# Patient Record
Sex: Female | Born: 1965 | Race: White | Hispanic: No | Marital: Married | Smoking: Current every day smoker
Health system: Southern US, Community
[De-identification: ages and names within clinical notes are randomized; demographics above are authoritative.]

## PROBLEM LIST (undated history)

## (undated) DIAGNOSIS — I10 Essential (primary) hypertension: Secondary | ICD-10-CM

## (undated) DIAGNOSIS — M4310 Spondylolisthesis, site unspecified: Secondary | ICD-10-CM

## (undated) DIAGNOSIS — K279 Peptic ulcer, site unspecified, unspecified as acute or chronic, without hemorrhage or perforation: Secondary | ICD-10-CM

## (undated) DIAGNOSIS — Z9889 Other specified postprocedural states: Secondary | ICD-10-CM

## (undated) DIAGNOSIS — G43909 Migraine, unspecified, not intractable, without status migrainosus: Secondary | ICD-10-CM

## (undated) DIAGNOSIS — M549 Dorsalgia, unspecified: Secondary | ICD-10-CM

## (undated) DIAGNOSIS — Z720 Tobacco use: Secondary | ICD-10-CM

## (undated) HISTORY — DX: Tobacco use: Z72.0

## (undated) HISTORY — PX: COLONOSCOPY: SHX174

## (undated) HISTORY — DX: Spondylolisthesis, site unspecified: M43.10

## (undated) HISTORY — DX: Dorsalgia, unspecified: M54.9

## (undated) HISTORY — DX: Migraine, unspecified, not intractable, without status migrainosus: G43.909

## (undated) HISTORY — DX: Peptic ulcer, site unspecified, unspecified as acute or chronic, without hemorrhage or perforation: K27.9

## (undated) HISTORY — PX: DILATION AND CURETTAGE OF UTERUS: SHX78

## (undated) HISTORY — PX: LAPAROSCOPY: SHX197

---
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
## 2004-12-03 ENCOUNTER — Ambulatory Visit: Payer: Self-pay | Admitting: Chiropractic Medicine

## 2005-11-07 HISTORY — PX: ABDOMINAL HYSTERECTOMY: SHX81

## 2005-11-14 ENCOUNTER — Ambulatory Visit: Payer: Self-pay | Admitting: Internal Medicine

## 2006-02-03 ENCOUNTER — Ambulatory Visit: Payer: Self-pay | Admitting: Internal Medicine

## 2006-02-11 ENCOUNTER — Emergency Department: Payer: Self-pay | Admitting: Emergency Medicine

## 2006-03-09 ENCOUNTER — Ambulatory Visit: Payer: Self-pay | Admitting: Unknown Physician Specialty

## 2006-04-18 ENCOUNTER — Inpatient Hospital Stay: Payer: Self-pay | Admitting: Unknown Physician Specialty

## 2006-11-07 HISTORY — PX: CERVICAL FUSION: SHX112

## 2007-05-24 ENCOUNTER — Ambulatory Visit: Payer: Self-pay | Admitting: Chiropractic Medicine

## 2007-06-26 ENCOUNTER — Ambulatory Visit (HOSPITAL_COMMUNITY): Admission: RE | Admit: 2007-06-26 | Discharge: 2007-06-26 | Payer: Self-pay | Admitting: Neurosurgery

## 2007-08-15 ENCOUNTER — Encounter: Admission: RE | Admit: 2007-08-15 | Discharge: 2007-08-15 | Payer: Self-pay | Admitting: Neurosurgery

## 2009-01-21 ENCOUNTER — Ambulatory Visit: Payer: Self-pay

## 2010-02-24 ENCOUNTER — Ambulatory Visit: Payer: Self-pay | Admitting: Internal Medicine

## 2010-06-08 ENCOUNTER — Encounter: Admission: RE | Admit: 2010-06-08 | Discharge: 2010-06-08 | Payer: Self-pay | Admitting: Neurosurgery

## 2010-06-14 ENCOUNTER — Encounter: Payer: Self-pay | Admitting: Neurosurgery

## 2010-07-08 ENCOUNTER — Encounter: Payer: Self-pay | Admitting: Neurosurgery

## 2010-08-07 ENCOUNTER — Encounter: Payer: Self-pay | Admitting: Neurosurgery

## 2010-10-12 ENCOUNTER — Ambulatory Visit: Payer: Self-pay | Admitting: Gynecologic Oncology

## 2010-10-15 ENCOUNTER — Ambulatory Visit: Payer: Self-pay | Admitting: Unknown Physician Specialty

## 2010-11-07 ENCOUNTER — Ambulatory Visit: Payer: Self-pay | Admitting: Gynecologic Oncology

## 2011-03-22 NOTE — Op Note (Signed)
Rebecca Barker, Rebecca Barker            ACCOUNT NO.:  [id number]   MEDICAL RECORD NO.:  [id number]          PATIENT TYPE:  OIB   LOCATION:  3172                         FACILITY:  [facility]   PHYSICIAN:  Kathaleen Maser. Pool, M.D.    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   POSTOPERATIVE DIAGNOSIS:  Left C5-6 spondylosis and left C6-7  spondylosis with herniated pulposus and radiculopathy.   PROCEDURE NOTE:  C5-6 and C6-7 anterior cervical diskectomy and fusion  with allograft and anterior plating.   SURGEON:  Kathaleen Maser. Pool, M.D.   ASSISTANT:  Reinaldo Meeker, M.D.   ANESTHESIA:  General endotracheal.   INDICATIONS:  Rebecca Barker is a 45 year old female with a history of neck  and left upper extremity pain, paresthesias and weakness mostly  consistent with a left-sided C7 radiculopathy, although some elements of  a left-sided C6 radiculopathy as well.  Workup demonstrates evidence of  moderately severe spondylosis, worse on the left side at C5-6 with  neural foraminal narrowing.  The patient has evidence of marked  spondylosis with associated disk herniation at C6-7 off to the left side  causing marked compression of the left-sided C7 nerve root.  The patient  has been counseled as to her options.  She has decided to proceed with a  C5-6 and C6-7 anterior cervical diskectomy and fusion with allograft and  anterior plating in hopes of improving symptoms.   OPERATIVE NOTE:  The patient taken to the operating room and placed on  the table in a supine position.  After an adequate level of anesthesia  was achieved, the patient was positioned supine with the neck slightly  extended and held in place with halter traction.  The patient's anterior  cervical region was prepped and draped sterilely.   A 10 blade was used  to make a linear skin incision overlying the C5-6 interspace.  This was  carried down sharply to the platysma.  The platysma was then divided  vertically and dissection proceeded along the medial border of the  sternomastoid muscle and carotid sheath.  Trachea and esophagus were  mobilized and retracted towards the left.  Prevertebral fascia was  stripped off the anterior spinal column.  The longus colli muscle was  then elevated bilaterally using electrocautery.  Deep self-retaining  retractor was placed.  Intraoperative fluoroscopy was used and the C5-6  and C6-7 levels were confirmed.  Disk spaces at both levels were then  incised with a 15 blade in rectangular fashion.  A wide disk space clean-  out was achieved using pituitary rongeurs, forward and backward-angled  Carlen curettes, Kerrison rongeurs, high-speed drill.  All elements of  the disk were removed down to the level of the posterior annulus.  The  microscope was then brought to the field and used throughout the  remainder of the diskectomy.  Remaining aspects of the annulus and  osteophytes were removed using a high-speed drill  down to the level of  the posterior longitudinal ligament.  The posterior longitudinal  ligament was then elevated and resected in a piecemeal fashion using the  Kerrison rongeurs, carried down.  The thecal sac was then identified.  A  wide central decompression was then performed undercutting the bodies of  C5 and C6.  Decompression then proceeded out each neural foramen.  Wide  anterior foraminotomies were then performed along the course of the  exiting C6 nerve root bilaterally.  At this point a very thorough  decompression had been achieved.  There was no injury to thecal sac or  nerve roots.  The procedure was then repeated at C6-7 again without  complication.  The wound was then irrigated with antibiotic solution.  Gelfoam was placed topically for hemostasis and found to be  good.  A 5-  mm LifeNet allograft wedge was then impacted into place at C5-6 and  recessed roughly 1 mm from the anterior cortical margin.  A 6-mm graft  was placed at C6-7.  A 42-mm Atlantis anterior cervical plate was then  placed over the C5, C6 and C7 levels.  This was then attached under  fluoroscopic guidance using 13-mm variable-angle screws, two each at all  levels.  All screws were given a final tightening and found to be  solidly within bone.  The locking screws were engaged at all three  levels.  Final images revealed good position of the bone grafts and  hardware, proper operative level, and normal alignment of the spine.  The wound was then irrigated one final time.  Hemostasis was ensured  with bipolar electrocautery.  The wound was then closed in layers with  sutures.  A sterile dressing was then applied.  There were no  complications.  The patient tolerated the procedure well and she returns  to the recovery room postoperatively.           ______________________________  Kathaleen Maser Pool, M.D.     HAP/MEDQ  D:  06/26/2007  T:  06/26/2007  Job:  [id number]

## 2011-06-26 ENCOUNTER — Emergency Department: Payer: Self-pay | Admitting: *Deleted

## 2011-08-01 ENCOUNTER — Ambulatory Visit: Payer: Self-pay | Admitting: Gastroenterology

## 2011-08-04 LAB — PATHOLOGY REPORT

## 2011-08-19 LAB — CBC
Hemoglobin: 16.1 — ABNORMAL HIGH
MCHC: 34.5
MCV: 88.2
Platelets: 479 — ABNORMAL HIGH
RBC: 5.31 — ABNORMAL HIGH

## 2011-08-19 LAB — COMPREHENSIVE METABOLIC PANEL
ALT: 37 — ABNORMAL HIGH
AST: 24
Albumin: 3.7
BUN: 13
CO2: 33 — ABNORMAL HIGH
GFR calc non Af Amer: >60
Glucose, Bld: 79
Sodium: 140
Total Protein: 6.6

## 2011-09-16 ENCOUNTER — Ambulatory Visit: Payer: Self-pay | Admitting: Internal Medicine

## 2011-11-07 ENCOUNTER — Ambulatory Visit: Payer: Self-pay | Admitting: Internal Medicine

## 2012-05-14 ENCOUNTER — Ambulatory Visit: Payer: Self-pay | Admitting: Oncology

## 2014-04-03 DIAGNOSIS — E8940 Asymptomatic postprocedural ovarian failure: Secondary | ICD-10-CM | POA: Insufficient documentation

## 2014-04-03 DIAGNOSIS — K279 Peptic ulcer, site unspecified, unspecified as acute or chronic, without hemorrhage or perforation: Secondary | ICD-10-CM | POA: Insufficient documentation

## 2014-04-03 DIAGNOSIS — G43909 Migraine, unspecified, not intractable, without status migrainosus: Secondary | ICD-10-CM | POA: Insufficient documentation

## 2014-04-04 DIAGNOSIS — M519 Unspecified thoracic, thoracolumbar and lumbosacral intervertebral disc disorder: Secondary | ICD-10-CM | POA: Insufficient documentation

## 2014-06-18 ENCOUNTER — Ambulatory Visit: Payer: Self-pay | Admitting: Internal Medicine

## 2017-05-11 ENCOUNTER — Other Ambulatory Visit: Payer: Self-pay | Admitting: Internal Medicine

## 2017-05-11 DIAGNOSIS — M5116 Intervertebral disc disorders with radiculopathy, lumbar region: Secondary | ICD-10-CM

## 2017-05-13 ENCOUNTER — Ambulatory Visit: Payer: BC Managed Care – PPO

## 2017-05-16 ENCOUNTER — Ambulatory Visit
Admission: RE | Admit: 2017-05-16 | Discharge: 2017-05-16 | Disposition: A | Discharge status: Home / Self Care | Payer: BC Managed Care – PPO | Source: Ambulatory Visit | Attending: Internal Medicine | Admitting: Internal Medicine

## 2017-05-16 DIAGNOSIS — M5136 Other intervertebral disc degeneration, lumbar region: Secondary | ICD-10-CM | POA: Diagnosis not present

## 2017-05-16 DIAGNOSIS — M5116 Intervertebral disc disorders with radiculopathy, lumbar region: Secondary | ICD-10-CM

## 2017-05-16 DIAGNOSIS — M5416 Radiculopathy, lumbar region: Secondary | ICD-10-CM | POA: Insufficient documentation

## 2017-05-16 DIAGNOSIS — M4306 Spondylolysis, lumbar region: Secondary | ICD-10-CM | POA: Insufficient documentation

## 2017-05-16 DIAGNOSIS — M48061 Spinal stenosis, lumbar region without neurogenic claudication: Secondary | ICD-10-CM | POA: Diagnosis not present

## 2017-05-16 DIAGNOSIS — M5126 Other intervertebral disc displacement, lumbar region: Secondary | ICD-10-CM | POA: Diagnosis not present

## 2017-06-07 ENCOUNTER — Ambulatory Visit (HOSPITAL_BASED_OUTPATIENT_CLINIC_OR_DEPARTMENT_OTHER): Payer: BC Managed Care – PPO | Admitting: Student in an Organized Health Care Education/Training Program

## 2017-06-07 ENCOUNTER — Encounter: Payer: Self-pay | Admitting: Student in an Organized Health Care Education/Training Program

## 2017-06-07 ENCOUNTER — Ambulatory Visit
Admission: RE | Admit: 2017-06-07 | Discharge: 2017-06-07 | Disposition: A | Discharge status: Home / Self Care | Payer: BC Managed Care – PPO | Source: Ambulatory Visit | Attending: Student in an Organized Health Care Education/Training Program | Admitting: Student in an Organized Health Care Education/Training Program

## 2017-06-07 VITALS — BP 129/92 | HR 69 | Temp 97.0°F | Resp 16 | Ht 68.0 in | Wt 206.0 lb

## 2017-06-07 DIAGNOSIS — M4317 Spondylolisthesis, lumbosacral region: Secondary | ICD-10-CM | POA: Diagnosis not present

## 2017-06-07 DIAGNOSIS — M48061 Spinal stenosis, lumbar region without neurogenic claudication: Secondary | ICD-10-CM | POA: Diagnosis not present

## 2017-06-07 DIAGNOSIS — Z79899 Other long term (current) drug therapy: Secondary | ICD-10-CM | POA: Insufficient documentation

## 2017-06-07 DIAGNOSIS — G43909 Migraine, unspecified, not intractable, without status migrainosus: Secondary | ICD-10-CM | POA: Insufficient documentation

## 2017-06-07 DIAGNOSIS — Z6831 Body mass index (BMI) 31.0-31.9, adult: Secondary | ICD-10-CM | POA: Insufficient documentation

## 2017-06-07 DIAGNOSIS — M79604 Pain in right leg: Secondary | ICD-10-CM | POA: Diagnosis not present

## 2017-06-07 DIAGNOSIS — M5136 Other intervertebral disc degeneration, lumbar region: Secondary | ICD-10-CM

## 2017-06-07 DIAGNOSIS — M5416 Radiculopathy, lumbar region: Secondary | ICD-10-CM | POA: Diagnosis present

## 2017-06-07 DIAGNOSIS — M4306 Spondylolysis, lumbar region: Secondary | ICD-10-CM | POA: Diagnosis not present

## 2017-06-07 DIAGNOSIS — E669 Obesity, unspecified: Secondary | ICD-10-CM | POA: Insufficient documentation

## 2017-06-07 DIAGNOSIS — G43809 Other migraine, not intractable, without status migrainosus: Secondary | ICD-10-CM

## 2017-06-07 DIAGNOSIS — M79605 Pain in left leg: Secondary | ICD-10-CM

## 2017-06-07 DIAGNOSIS — M5116 Intervertebral disc disorders with radiculopathy, lumbar region: Secondary | ICD-10-CM | POA: Insufficient documentation

## 2017-06-07 MED ORDER — IOPAMIDOL (ISOVUE-M 200) INJECTION 41%
2.0000 mL | Freq: Once | INTRAMUSCULAR | Status: AC
  Administered 2017-06-07: 10 mL via EPIDURAL

## 2017-06-07 MED ORDER — DEXAMETHASONE SODIUM PHOSPHATE 10 MG/ML IJ SOLN
INTRAMUSCULAR | Status: AC
  Filled 2017-06-07: qty 1

## 2017-06-07 MED ORDER — LIDOCAINE HCL (PF) 1 % IJ SOLN
INTRAMUSCULAR | Status: AC
  Filled 2017-06-07: qty 5

## 2017-06-07 MED ORDER — BUPIVACAINE HCL (PF) 0.25 % IJ SOLN
1.0000 mL | Freq: Once | INTRAMUSCULAR | Status: AC
  Administered 2017-06-07: 30 mL

## 2017-06-07 MED ORDER — BUPIVACAINE HCL (PF) 0.25 % IJ SOLN
INTRAMUSCULAR | Status: AC
  Filled 2017-06-07: qty 30

## 2017-06-07 MED ORDER — SODIUM CHLORIDE 0.9 % IJ SOLN
INTRAMUSCULAR | Status: AC
  Filled 2017-06-07: qty 10

## 2017-06-07 MED ORDER — IOPAMIDOL (ISOVUE-M 200) INJECTION 41%
INTRAMUSCULAR | Status: AC
  Filled 2017-06-07: qty 10

## 2017-06-07 MED ORDER — SODIUM CHLORIDE 0.9% FLUSH
6.0000 mL | Freq: Once | INTRAVENOUS | Status: AC
  Administered 2017-06-07: 10 mL

## 2017-06-07 MED ORDER — LIDOCAINE HCL (PF) 1 % IJ SOLN
2.0000 mL | Freq: Once | INTRAMUSCULAR | Status: AC
  Administered 2017-06-07: 5 mL via INTRADERMAL

## 2017-06-07 MED ORDER — DEXAMETHASONE SODIUM PHOSPHATE 10 MG/ML IJ SOLN
10.0000 mg | Freq: Once | INTRAMUSCULAR | Status: AC
  Administered 2017-06-07: 10 mg via INTRA_ARTICULAR

## 2017-06-07 NOTE — Progress Notes (Signed)
Safety precautions to be maintained throughout the outpatient stay will include: orient to surroundings, keep bed in low position, maintain call bell within reach at all times, provide assistance with transfer out of bed and ambulation.  

## 2017-06-07 NOTE — Progress Notes (Signed)
Patient's Name: Rebecca Barker  MRN: [id number]  Referring Provider: Meade Maw, MD  DOB: Mar 09, 1966  PCP: Rusty Aus, MD  DOS: 06/07/2017  Note by: Gillis Santa, MD  Service setting: Ambulatory outpatient  Specialty: Interventional Pain Management  Location: [facility] (AMB) Pain Management Facility    Patient type: New patient ("FAST-TRACK" Evaluation)   Warning: This referral option does not include the extensive pharmacological evaluation required for [place] to take over the patient's medication management. The "Fast-Track" system is designed to bypass the new patient referral waiting list, as well as the normal patient evaluation process, in order to provide a patient in distress with a timely pain management intervention. Because the system was not designed to unfairly get a patient into our pain practice ahead of those already waiting, certain restrictions apply. By requesting a "Fast-Track" consult, the referring physician has opted to continue managing the patient's medications in order to get interventional urgent care.  Primary Reason for Visit: Interventional Pain Management Treatment. CC: Back Pain (low) and Leg Pain (right posterior to knee)   Procedure: Lumbar ESI (L4/L5)  HPI  Ms. Rebecca Barker is a 51 y.o. year old, female patient, who comes today for a  "Fast-Track" new patient evaluation, as requested by Meade Maw, MD. The patient has been made aware that this type of referral option is reserved for the Interventional Pain Management portion of our practice and completely excludes the option of medication management. Her primarily concern today is the Back Pain (low) and Leg Pain (right posterior to knee)  Pain Assessment: Location: Lower Back Radiating: right posterior leg Onset: More than a month ago Duration: Chronic pain Quality: Sharp, Sore, Constant (deep) Severity: 6 /10 (self-reported pain score)  Note: Reported level is compatible with observation.                    Effect on ADL:   Timing: Constant Modifying factors: positioning, TENS, bending and stretching  Onset and Duration: Gradual and Present longer than 3 months Cause of pain: Unknown Severity: No change since onset, NAS-11 at its worse: 10/10, NAS-11 at its best: 3/10, NAS-11 now: 4/10 and NAS-11 on the average: 7/10 Timing: Morning, Night, During activity or exercise, After activity or exercise and After a period of immobility Aggravating Factors: Bending, Lifiting, Prolonged sitting and Prolonged standing Alleviating Factors: Stretching, Hot packs, Medications and TENS Associated Problems: Numbness, Tingling and Pain that wakes patient up Quality of Pain: Aching, Burning, Constant, Deep, Sharp, Stabbing and Tender Previous Examinations or Tests: MRI scan and Neurological evaluation Previous Treatments: Chiropractic manipulations, Narcotic medications, Physical Therapy, Stretching exercises, TENS and Trigger point injections  The patient comes into the clinics today, referred to [place] for a epidural steroid injection.  Meds   Current Meds  Medication Sig  . buPROPion (WELLBUTRIN XL) 150 MG 24 hr tablet Take 150 mg by mouth daily.  . cyclobenzaprine (FLEXERIL) 10 MG tablet Take 10 mg by mouth as needed for muscle spasms.  . hydrochlorothiazide (HYDRODIURIL) 25 MG tablet Take 25 mg by mouth daily.  Marland Kitchen HYDROcodone-acetaminophen (NORCO/VICODIN) 5-325 MG tablet Take 1 tablet by mouth as needed for moderate pain.  . meloxicam (MOBIC) 7.5 MG tablet Take 7.5 mg by mouth as needed for pain.  . pantoprazole (PROTONIX) 40 MG tablet Take 40 mg by mouth daily.    Imaging Review  Cervical Imaging: Cervical MR wo contrast:  Results for orders placed in visit on 05/24/07  [place] W/O Cm  Narrative  PRIOR REPORT IMPORTED FROM AN EXTERNAL SYSTEM   PRIOR REPORT IMPORTED FROM THE [facility] WORKFLOW SYSTEM   REASON FOR EXAM:    severe neck lt arm pain with tingeing and numbness  COMMENTS:    PROCEDURE:     MR  - MR CERVICAL SPINE WO CONT  - May 24 2007  2:42PM   RESULT:     Comparison: No available comparison exam.   Procedure: Multiplanar, multisequence MR examination of the cervical spine  was performed without gadolinium contrast.   Findings:   There is straightening of the cervical spine without malalignment. The  cervical cord and cervicomedullary junction are is unremarkable. Small  lesion with increased T1 signal involving the C3 and C6 vertebrae likely  represents small hemangiomas. No other significant bone marrow signal  abnormality is noted.   Multilevel disc desiccation is noted. There is disc space height loss  involving the lower cervical spine, most prominently at the C6-C7 level,  where it is mild to moderate in severity. Central disc osteophyte  complexes  are noted at the C5-C7 levels with flattening of the anterior thecal sac.  There is mild right and mild to moderate left C6-C7 neural foraminal  narrowing.   IMPRESSION:   1. Degenerative changes of the lower cervical spine are seen an described  above. There is mild right and mild to moderate left C6-C7 neural  foraminal  narrowing.   Thank you for this opportunity to contribute to the care of your patient.       Cervical DG 1 view:  Results for orders placed during the hospital encounter of 06/26/07  DG Cervical Spine 1 View   Narrative Clinical Data: ACDF, HNP. CERVICAL SPINE - 1 VIEW: Comparison: None. Findings: Anterior cervical discectomy fusion hardware is seen from C5 through probable T1 level (inferior cervical spine less well visualized due to shoulders).  Posterior vertebral alignment is normally maintained. IMPRESSION:  Anterior cervical discectomy fusion hardware from C5 through probable T1.  Provider: Burnett Sheng, Tyson Dense, John Strider   Cervical DG 2-3 views:  Results for orders placed during the hospital encounter of 06/08/10  DG Cervical Spine 2-3 Views    Narrative Clinical Data: Cervical spine surgery in 2008, some neck pain   CERVICAL SPINE - 2-3 VIEW   Comparison: Cervical spine films of 08/15/2007   Findings: The cervical vertebrae are straightened in alignment. There does appear to be solid bony fusion from C5-C7 with no change in position of the anterior metallic fixation plate.  Through flexion and extension there is limited range of motion with no malalignment noted.   IMPRESSION: Straightened alignment.  Solid fusion from C5-C7.  Limited range of motion.  Provider: Pearla Dubonnet    Lumbosacral Imaging: Lumbar MR wo contrast:  Results for orders placed during the hospital encounter of 05/16/17  MR LUMBAR SPINE WO CONTRAST   Narrative CLINICAL DATA:  Initial evaluation for right-sided low back pain radiating into right lower extremity for 3 weeks.  EXAM: MRI LUMBAR SPINE WITHOUT CONTRAST  TECHNIQUE: Multiplanar, multisequence MR imaging of the lumbar spine was performed. No intravenous contrast was administered.  COMPARISON:  Prior MRI from 11/07/2011.  FINDINGS: Segmentation: Normal segmentation. Lowest well-formed disc is labeled the L5-S1 level.  Alignment: Trace retrolisthesis of L3 on L4. Vertebral bodies otherwise normally aligned with preservation of the normal lumbar lordosis.  Vertebrae: Vertebral body heights are maintained. No evidence for acute or chronic fracture. Few small benign hemangiomas noted. Chronic reactive  endplate changes noted about the L5-S1 interspace. Signal intensity within the vertebral body bone marrow otherwise normal. No worrisome osseous lesions. No abnormal marrow edema.  Conus medullaris: Extends to the L1 level and appears normal.  Paraspinal and other soft tissues: Paraspinous soft tissues within normal limits. Visualized visceral structures are normal.  Disc levels:  L1-2: Mild circumferential disc bulge with disc desiccation. Small posterior annular fissure noted. No  canal or foraminal stenosis.  L2-3:  Unremarkable.  L3-4: Diffuse circumferential disc bulge with disc desiccation. Mild facet and ligamentum flavum hypertrophy. No significant canal stenosis. Mild bilateral L3 foraminal narrowing.  L4-5:  Normal interspace.  Mild facet hypertrophy.  No stenosis.  L5-S1: Chronic degenerative intervertebral disc space narrowing with reactive endplate changes and marginal endplate osteophytes. Resultant shallow posterior disc osteophyte complex mildly indents the ventral thecal sac without significant stenosis. No neural impingement. Mild bilateral L5 foraminal narrowing.  IMPRESSION: 1. 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 2. Mild degenerative disc bulging at L1-2 and L3-4 without stenosis or neural impingement.   Electronically Signed   By: Jeannine Boga M.D.   On: 05/17/2017 04:35    Lumbar MR wo contrast:  Results for orders placed in visit on 11/07/11  MR [facility] W/O Cm   Narrative PRIOR REPORT IMPORTED FROM AN EXTERNAL SYSTEM   PRIOR REPORT IMPORTED FROM THE [facility] [place] FOR EXAM:    lumbar  radiculopathy  COMMENTS:   PROCEDURE:     MMR - MMR LUMBAR SPINE WO CONTRAST  - Nov 07 2011  9:13AM   RESULT:   Technique: Multiplanar and multisequence imaging of the lumbar spine was  obtained without the administration of gadolinium.   Conus medullaris terminates at an L1 level. The cauda equina demonstrate  no  evidence of clumping or thickening.   At the T12-L1 level, there is no evidence of thecal sac stenosis or  neuroforaminal narrowing.   At the L1-L2 level, a broad-based disc bulge appreciated with a focal area  of central protrusion. This causes partial effacement of the anterior CSF  space and mild to moderate thecal sac stenosis. There is no evidence of  neuroforaminal narrowing.   At the L2-L3 level, there is no evidence of thecal sac  stenosis or  neuroforaminal narrowing.   At the L3-L4 level, a broad-based disc bulge is appreciated causing  partial  effacement of the anterior CSF space. There is mild thecal sac narrowing.  No  evidence of neuroforaminal narrowing.   At the L4-L5 level, there is no evidence of thecal sac stenosis or  neuroforaminal narrowing.   At the L5-S1 level, a central disc protrusion is appreciated with minimal  effacement of the anterior CSF space and minimal thecal sac narrowing.  There  is lateralization of disc bulge to the left as well as to the right. This  continues to bilateral mild neuroforaminal narrowing also secondary to  endplate hypertrophic spurring. There does not appear to be evidence of  exiting nerve root compression though mild compromise cannot be excluded.  There is no evidence of marrow edema or linear signal void reflecting  acute  fracture.   IMPRESSION:   Degenerative disc disease changes as described above. There is moderate  thecal sac stenosis at the L2-L3 level and mild at the L5-S1 levels. Mild  bilateral neuroforaminal narrowing is appreciated L5-S1 as described  above.   Thank you for the opportunity to contribute to the  care of your patient.        Complexity Note: Imaging results reviewed. Results discussed using Layman's terms.               ROS  Cardiovascular History: No reported cardiovascular signs or symptoms such as High blood pressure, coronary artery disease, abnormal heart rate or rhythm, heart attack, blood thinner therapy or heart weakness and/or failure Pulmonary or Respiratory History: Smoking and Snoring  Neurological History: No reported neurological signs or symptoms such as seizures, abnormal skin sensations, urinary and/or fecal incontinence, being born with an abnormal open spine and/or a tethered spinal cord Review of Past Neurological Studies:  Results for orders placed or performed in visit on 02/03/06  MR Brain W Wo  Contrast   Narrative    PRIOR REPORT IMPORTED FROM AN EXTERNAL SYSTEM *  PRIOR REPORT IMPORTED FROM THE [facility] WORKFLOW SYSTEM   REASON FOR EXAM:  Progressive headaches  COMMENTS:   PROCEDURE:     MR  - MR BRAIN WO/W CONTRAST  - Feb 03 2006  9:37AM   RESULT:     The patient is experiencing progressive headaches.   There are no prior studies available for comparison.   TECHNIQUE: Multiplanar/multisequence imaging of the brain is obtained.   FINDINGS: Diffusion weighted images reveal no evidence of acute ischemia.  No  mass lesions or enhancing lesions are noted. A single, deep white matter,  RIGHT, periventricular punctate signal abnormality is noted on FLAIR  images.  This could represent a primary white matter lesion plaque as might be  caused  by MS, a focal site of other white matter disease including vasculitis or  chronic ischemia, or sequelae of migraine headaches. This is a solitary  lesion. The lesion is millimeters in size and does not enhance. The  posterior fossa is unremarkable. The brainstem is unremarkable. Vascular  flow-voids are normal.   IMPRESSION:   1.     Single white matter lesion in the RIGHT corona radiata adjacent to  the RIGHT lateral ventricle with differential diagnosis as noted above.  2.     The exam is otherwise unremarkable.   Thank you for this opportunity to contribute to the care of your patient.       Psychological-Psychiatric History: No reported psychological or psychiatric signs or symptoms such as difficulty sleeping, anxiety, depression, delusions or hallucinations (schizophrenial), mood swings (bipolar disorders) or suicidal ideations or attempts Gastrointestinal History: Vomiting blood (Ulcers) Genitourinary History: No reported renal or genitourinary signs or symptoms such as difficulty voiding or producing urine, peeing blood, non-functioning kidney, kidney stones, difficulty emptying the bladder, difficulty controlling the flow of  urine, or chronic kidney disease Hematological History: No reported hematological signs or symptoms such as prolonged bleeding, low or poor functioning platelets, bruising or bleeding easily, hereditary bleeding problems, low energy levels due to low hemoglobin or being anemic Endocrine History: No reported endocrine signs or symptoms such as high or low blood sugar, rapid heart rate due to high thyroid levels, obesity or weight gain due to slow thyroid or thyroid disease Rheumatologic History: No reported rheumatological signs and symptoms such as fatigue, joint pain, tenderness, swelling, redness, heat, stiffness, decreased range of motion, with or without associated rash Musculoskeletal History: Negative for myasthenia gravis, muscular dystrophy, multiple sclerosis or malignant hyperthermia Work History: Working full time  Allergies  Ms. Sterba is allergic to betadine [povidone iodine].  Laboratory Chemistry  Inflammation Markers (CRP: Acute Phase) (ESR: Chronic Phase) No results found for: CRP,  ESRSEDRATE               Renal Function Markers Lab Results  Component Value Date   BUN 13 06/22/2007   CREATININE 0.82 06/22/2007   GFRAA  06/22/2007    >60        The eGFR has been calculated using the MDRD equation. This calculation has not been validated in all clinical   GFRNONAA >60 06/22/2007                 Hepatic Function Markers Lab Results  Component Value Date   AST 24 06/22/2007   ALT 37 (H) 06/22/2007   ALBUMIN 3.7 06/22/2007   ALKPHOS 86 06/22/2007                 Electrolytes Lab Results  Component Value Date   NA 140 06/22/2007   K 3.5 06/22/2007   CL 99 06/22/2007   CALCIUM 9.7 06/22/2007                 Neuropathy Markers No results found for: [id number]               Bone Pathology Markers Lab Results  Component Value Date   ALKPHOS 86 06/22/2007   CALCIUM 9.7 06/22/2007                 Coagulation Parameters Lab Results  Component Value Date    PLT 479 (H) 06/22/2007                 Cardiovascular Markers Lab Results  Component Value Date   HGB 16.1 (H) 06/22/2007   HCT 46.8 (H) 06/22/2007                 Note: Lab results reviewed.  [place]  Drug: Ms. Leason  has no drug history on file. Alcohol:  has no alcohol history on file. Tobacco:  reports that she has been smoking Cigarettes.  She started smoking about 15 years ago. She has a 7.50 pack-year smoking history. She has never used smokeless tobacco. Medical:  has a past medical history of Migraines and Peptic ulcer. Family: family history includes Alcohol abuse in her paternal grandfather; Arthritis in her maternal grandmother, mother, and paternal grandmother; Birth defects in her brother; COPD in her father; Cancer in her paternal grandmother; Diabetes in her maternal grandfather; Hypertension in her mother; Kidney disease in her maternal grandmother; Learning disabilities in her brother; Mental retardation in her brother; Miscarriages / Stillbirths in her mother; Stroke in her maternal grandfather and maternal grandmother; Varicose Veins in her mother.  Past Surgical History:  Procedure Laterality Date  . CERVICAL FUSION  2008  . [place] and 2001  . FOOT SURGERY  1980  . LAPAROSCOPY     Active Ambulatory Problems    Diagnosis Date Noted  . No Active Ambulatory Problems   Resolved Ambulatory Problems    Diagnosis Date Noted  . No Resolved Ambulatory Problems   Past Medical History:  Diagnosis Date  . Migraines   . Peptic ulcer    Constitutional Exam  General appearance: Well nourished, well developed, and well hydrated. In no apparent acute distress Vitals:   06/07/17 1205 06/07/17 1210 06/07/17 1215 06/07/17 1220  BP: 137/87 129/85 118/86 (!) 129/92  Pulse: 76 76  69  Resp: _0 Temp:  (!) 97 F (36.1 C) (!) 97 F (36.1 C)   TempSrc:  Tympanic    SpO2:  99% 99%  99%  Weight:      Height:       BMI Assessment: Estimated body  mass index is 31.32 kg/m as calculated from the following:   Height as of this encounter: _0  (1.727 m).   Weight as of this encounter: 206 lb (93.4 kg).  BMI interpretation table: BMI level Category Range association with higher incidence of chronic pain  <18 kg/m2 Underweight   18.5-24.9 kg/m2 Ideal body weight   25-29.9 kg/m2 Overweight Increased incidence by 20%  30-34.9 kg/m2 Obese (Class I) Increased incidence by 68%  35-39.9 kg/m2 Severe obesity (Class II) Increased incidence by 136%  >40 kg/m2 Extreme obesity (Class III) Increased incidence by 254%   BMI Readings from Last 4 Encounters:  06/07/17 31.32 kg/m   Wt Readings from Last 4 Encounters:  06/07/17 206 lb (93.4 kg)  Psych/Mental status: Alert, oriented x 3 (person, place, & time)       Eyes: PERLA Respiratory: No evidence of acute respiratory distress  Lumbar Spine Exam  Inspection: No masses, redness, or swelling Alignment: Symmetrical Functional ROM: Unrestricted ROM      Stability: No instability detected Muscle strength & Tone: Functionally intact Sensory: Unimpaired Palpation: No palpable anomalies       Provocative Tests: Lumbar Hyperextension and rotation test: Negative       Lumbar Lateral bending test: Negative       Patrick's Maneuver: Negative                    Gait & Posture Assessment  Ambulation: Unassisted Gait: Relatively normal for age and body habitus Posture: WNL   Lower Extremity Exam    Side: Right lower extremity  Side: Left lower extremity  Inspection: No masses, redness, swelling, or asymmetry. No contractures  Inspection: No masses, redness, swelling, or asymmetry. No contractures  Functional ROM: Unrestricted ROM          Functional ROM: Unrestricted ROM          Muscle strength & Tone: Functionally intact  Muscle strength & Tone: Functionally intact  Sensory: Unimpaired  Sensory: Unimpaired  Palpation: No palpable anomalies  Palpation: No palpable anomalies   1. Lumbar  radiculopathy   2. Lumbar spondylolysis   3. Pain in both lower extremities   4. Lumbar degenerative disc disease   5. Other migraine without status migrainosus, not intractable   6. Obesity, unspecified classification, unspecified obesity type, unspecified whether serious comorbidity present   7. Spinal stenosis of lumbar region without neurogenic claudication     Procedure- Lumbar Epidural Steroid Injection- see procedure note for procedural details  Patient is a 51 year old female with a past medical history of migraine headaches, lumbar degenerative disc disease, mild to moderate lumbar spinal canal stenosis, lumbar facet arthropathy, and subacute lumbar radiculopathy with low back pain and leg pain that is acutely worsened over the last month. Patient is being being referred to [place] from Dr. Cari Caraway.  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 Patient does endorse pain that radiates from her back into her legs in a dermatomal pattern.  Today we discussed the potential risks and benefits of a epidural steroid injection. Patient would like to proceed. She is continuing to do exercises at home.

## 2017-06-07 NOTE — Progress Notes (Signed)
Spoke with patient today had approximately 3:10 PM after her epidural steroid injection earlier this afternoon with the patient had a vasovagal reaction. She states that she is doing very well at home right now. She states that she had lunch and something to drink and that her mild headache has improved. In regards to her leg pain, she endorses known. She is able to ambulate around her house without as much discomfort and states that right now her pain score is 0. We will follow-up with her tomorrow to see how she is doing. If she has any questions or concerns, I have given her our clinic phone number to call.

## 2017-06-07 NOTE — Progress Notes (Signed)
Patient's Name: Rebecca Barker  MRN: [id number]  Referring Provider: Meade Maw, MD  DOB: [birth year]  PCP: Rusty Aus, MD  DOS: 06/07/2017  Note by: Gillis Santa, MD  Service setting: Ambulatory outpatient  Specialty: Interventional Pain Management  Patient type: Established  Location: [facility] (AMB) Pain Management Facility  Visit type: Interventional Procedure   Primary Reason for Visit: Interventional Pain Management Treatment. CC: Back Pain (low) and Leg Pain (right posterior to knee)  Procedure:  Anesthesia, Analgesia, Anxiolysis:  Type: Therapeutic Inter-Laminar Epidural Steroid Injection Region: Lumbar Level: L4-5 Level (attempted L5/S1 but encountered os and patient have discomfort even after additional local anesthetic through tuohy so moved to interspace above) Laterality: Midline         Type: Local Anesthesia Local Anesthetic: Lidocaine 1% Route: Infiltration ([place]/IM) IV Access: Secured Sedation: Meaningful verbal contact was maintained at all times during the procedure  Indication(s): Analgesia and Anxiety  Indications: 1. Lumbar radiculopathy   2. Lumbar spondylolysis   3. Pain in both lower extremities   4. Lumbar degenerative disc disease   5. Other migraine without status migrainosus, not intractable   6. Obesity, unspecified classification, unspecified obesity type, unspecified whether serious comorbidity present   7. Spinal stenosis of lumbar region without neurogenic claudication    Pain Score: Pre-procedure: 6 /10 Post-procedure: 0-No pain/10  Pre-op Assessment:  Previous date of service: 06/07/17 (new patient) Service provided:   Rebecca Barker is a 51 y.o. (year old), female patient, seen today for interventional treatment. She  has a past surgical history that includes Foot surgery (1980); Cesarean section (1999 and 2001); laparoscopy; and Cervical fusion (2008). Rebecca Barker has a current medication list which includes the following prescription(s):  bupropion, cyclobenzaprine, hydrochlorothiazide, hydrocodone-acetaminophen, meloxicam, and pantoprazole. Her primarily concern today is the Back Pain (low) and Leg Pain (right posterior to knee) (please see H&P note today since this patient was a fast-track patient)  Initial Vital Signs: Blood pressure (!) 135/96, pulse 77, temperature 98.2 F (36.8 C), temperature source Oral, resp. rate 18, height 5\' 8"  (1.727 m), weight 206 lb (93.4 kg), SpO2 98 %. BMI: Estimated body mass index is 31.32 kg/m as calculated from the following:   Height as of this encounter: 5\' 8"  (1.727 m).   Weight as of this encounter: 206 lb (93.4 kg).  Risk Assessment: Allergies: Reviewed. She is allergic to betadine [povidone iodine].  Allergy Precautions: None required Coagulopathies: Reviewed. None identified.  Blood-thinner therapy: None at this time Active Infection(s): Reviewed. None identified. Rebecca Barker is afebrile  Site Confirmation: Rebecca Barker was asked to confirm the procedure and laterality before marking the site Procedure checklist: Completed Consent: Before the procedure and under the influence of no sedative(s), amnesic(s), or anxiolytics, the patient was informed of the treatment options, risks and possible complications. To fulfill our ethical and legal obligations, as recommended by the American Medical Association's Code of Ethics, I have informed the patient of my clinical impression; the nature and purpose of the treatment or procedure; the risks, benefits, and possible complications of the intervention; the alternatives, including doing nothing; the risk(s) and benefit(s) of the alternative treatment(s) or procedure(s); and the risk(s) and benefit(s) of doing nothing. The patient was provided information about the general risks and possible complications associated with the procedure. These may include, but are not limited to: failure to achieve desired goals, infection, bleeding, organ or nerve  damage, allergic reactions, paralysis, and death. In addition, the patient was informed of those risks and  complications associated to Spine-related procedures, such as failure to decrease pain; infection (i.e.: Meningitis, epidural or intraspinal abscess); bleeding (i.e.: epidural hematoma, subarachnoid hemorrhage, or any other type of intraspinal or peri-dural bleeding); organ or nerve damage (i.e.: Any type of peripheral nerve, nerve root, or spinal cord injury) with subsequent damage to sensory, motor, and/or autonomic systems, resulting in permanent pain, numbness, and/or weakness of one or several areas of the body; allergic reactions; (i.e.: anaphylactic reaction); and/or death. Furthermore, the patient was informed of those risks and complications associated with the medications. These include, but are not limited to: allergic reactions (i.e.: anaphylactic or anaphylactoid reaction(s)); adrenal axis suppression; blood sugar elevation that in diabetics may result in ketoacidosis or comma; water retention that in patients with history of congestive heart failure may result in shortness of breath, pulmonary edema, and decompensation with resultant heart failure; weight gain; swelling or edema; medication-induced neural toxicity; particulate matter embolism and blood vessel occlusion with resultant organ, and/or nervous system infarction; and/or aseptic necrosis of one or more joints. Finally, the patient was informed that Medicine is not an exact science; therefore, there is also the possibility of unforeseen or unpredictable risks and/or possible complications that may result in a catastrophic outcome. The patient indicated having understood very clearly. We have given the patient no guarantees and we have made no promises. Enough time was given to the patient to ask questions, all of which were answered to the patient's satisfaction. Rebecca Barker has indicated that she wanted to continue with the  procedure. Attestation: I, the ordering provider, attest that I have discussed with the patient the benefits, risks, side-effects, alternatives, likelihood of achieving goals, and potential problems during recovery for the procedure that I have provided informed consent. Date: 06/07/2017; Time: 10:53 AM  Pre-Procedure Preparation:  Monitoring: As per clinic protocol. Respiration, ETCO2, SpO2, BP, heart rate and rhythm monitor placed and checked for adequate function Safety Precautions: Patient was assessed for positional comfort and pressure points before starting the procedure. Time-out: I initiated and conducted the "Time-out" before starting the procedure, as per protocol. The patient was asked to participate by confirming the accuracy of the "Time Out" information. Verification of the correct person, site, and procedure were performed and confirmed by me, the nursing staff, and the patient. "Time-out" conducted as per Joint Commission's Universal Protocol (UP.01.01.01). "Time-out" Date & Time: 06/07/2017; 1131 hrs.  Description of Procedure Process:   Position: Prone with head of the table was raised to facilitate breathing. Target Area: The interlaminar space, initially targeting the lower laminar border of the superior vertebral body. Approach: Paramedial approach. Area Prepped: Entire Posterior Lumbar Region Prepping solution: ChloraPrep (2% chlorhexidine gluconate and 70% isopropyl alcohol) Safety Precautions: Aspiration looking for blood return was conducted prior to all injections. At no point did we inject any substances, as a needle was being advanced. No attempts were made at seeking any paresthesias. Safe injection practices and needle disposal techniques used. Medications properly checked for expiration dates. SDV (single dose vial) medications used. Description of the Procedure: Protocol guidelines were followed. The procedure needle was introduced through the skin, ipsilateral to the  reported pain, and advanced to the target area. Bone was contacted and the needle walked caudad, until the lamina was cleared. The epidural space was identified using "loss-of-resistance technique" with 2-3 ml of PF-NaCl (0.9% NSS), in a 5cc LOR glass syringe. Vitals:   06/07/17 1205 06/07/17 1210 06/07/17 1215 06/07/17 1220  BP: 137/87 129/85 118/86 (!) 129/92  Pulse: 76  76  69  Resp: 16 16 16 16   Temp:  (!) 97 F (36.1 C) (!) 97 F (36.1 C)   TempSrc:  Tympanic    SpO2: 99% 99%  99%  Weight:      Height:        Start Time: 1137 hrs. End Time:   hrs. Materials:  Needle(s) Type: Epidural needle Gauge: 17G Length: 3.5-in Medication(s): We administered bupivacaine (PF), lidocaine (PF), dexamethasone, iopamidol, and sodium chloride flush. Please see chart orders for dosing details.  Imaging Guidance (Spinal):  Type of Imaging Technique: Fluoroscopy Guidance (Spinal) Indication(s): Assistance in needle guidance and placement for procedures requiring needle placement in or near specific anatomical locations not easily accessible without such assistance. Exposure Time: Please see nurses notes. Contrast: Before injecting any contrast, we confirmed that the patient did not have an allergy to iodine, shellfish, or radiological contrast. Once satisfactory needle placement was completed at the desired level, radiological contrast was injected. Contrast injected under live fluoroscopy. No contrast complications. See chart for type and volume of contrast used. Fluoroscopic Guidance: I was personally present during the use of fluoroscopy. "Tunnel Vision Technique" used to obtain the best possible view of the target area. Parallax error corrected before commencing the procedure. "Direction-depth-direction" technique used to introduce the needle under continuous pulsed fluoroscopy. Once target was reached, antero-posterior, oblique, and lateral fluoroscopic projection used confirm needle placement in all  planes. Images permanently stored in EMR. Interpretation: I personally interpreted the imaging intraoperatively. Adequate needle placement confirmed in multiple planes. Appropriate spread of contrast into desired area was observed. No evidence of afferent or efferent intravascular uptake. No intrathecal or subarachnoid spread observed. Permanent images saved into the patient's record.  Antibiotic Prophylaxis:  Indication(s): None identified Antibiotic given: None  Post-operative Assessment:  EBL: None Complications: No heme, CSF, or paresthesias. after the procedure was complete, the patient did have a vasovagal response where she had dizziness, diaphoresis, lower blood pressures and her baseline. Patient was transferred over to a stretcher. She was placed in Trendelenburg position so that her legs were in the air to facilitate preload back to the heart. At that time no shortness of breath or chest pain. 5 out of 5 strength in bilateral biceps, 5 out of 5 strength lower extremity to knee flexion, knee extension, ankle plantar flexion and dorsiflexion. Patient was taken to the recovery room where she was given crackers and something to drink and started feeling better. Patient did endorse a small left frontal headache upon leaving. I encouraged her to rest today and that we would call her tomorrow regarding her health. She is supposed to follow with me in 2 weeks. Note: The patient tolerated the entire procedure well. A repeat set of vitals were taken after the procedure and the patient was kept under observation following institutional policy, for this type of procedure. Post-procedural neurological assessment was performed, showing return to baseline, prior to discharge. The patient was provided with post-procedure discharge instructions, including a section on how to identify potential problems. Should any problems arise concerning this procedure, the patient was given instructions to immediately contact  us, at any time, without hesitation. In any case, we plan to contact the patient by telephone for a follow-up status report regarding this interventional procedure. Comments:  No additional relevant information.  Plan of Care  Disposition: Discharge home  Discharge Date & Time: 06/07/2017;   hrs.  Physician-requested Follow-up:  No Follow-up on file.  Future Appointments Date Time Provider [place]  06/20/2017 10:15 AM Gillis Santa, MD [facility] None   Medications ordered for procedure: Meds ordered this encounter  Medications  . bupivacaine (PF) (MARCAINE) 0.25 % injection 1 mL  . lidocaine (PF) (XYLOCAINE) 1 % injection 2 mL  . dexamethasone (DECADRON) injection 10 mg  . iopamidol (ISOVUE-M) 41 % intrathecal injection 2 mL  . sodium chloride flush (NS) 0.9 % injection 6 mL   Medications administered: We administered bupivacaine (PF), lidocaine (PF), dexamethasone, iopamidol, and sodium chloride flush.  See the medical record for exact dosing, route, and time of administration. Preservative-free normal saline: 6, Decadron 10 mg, bupivacaine 0.25% 1 cc, total 8 cc at L4-L5.  Lab-work, Procedure(s), & Referral(s) Ordered: Orders Placed This Encounter  Procedures  . Lumbar Epidural Injection  . DG C-Arm 1-60 Min-No Report  . Informed Consent Details: Transcribe to consent form and obtain patient signature  . Provider attestation of informed consent for procedure/surgical case  . Verify informed consent  . Discharge instructions  . Follow-up   Imaging Ordered: No results found for this or any previous visit. New Prescriptions   No medications on file   Primary Care Physician: Rusty Aus, MD Location: [facility] Note by: Gillis Santa, MD Date: 06/07/2017; Time: 12:44 PM  Disclaimer:  Medicine is not an exact science. The only guarantee in medicine is that nothing is guaranteed. It is important to note that the decision to proceed  with this intervention was based on the information collected from the patient. The Data and conclusions were drawn from the patient's questionnaire, the interview, and the physical examination. Because the information was provided in large part by the patient, it cannot be guaranteed that it has not been purposely or unconsciously manipulated. Every effort has been made to obtain as much relevant data as possible for this evaluation. It is important to note that the conclusions that lead to this procedure are derived in large part from the available data. Always take into account that the treatment will also be dependent on availability of resources and existing treatment guidelines, considered by other Pain Management Practitioners as being common knowledge and practice, at the time of the intervention. For Medico-Legal purposes, it is also important to point out that variation in procedural techniques and pharmacological choices are the acceptable norm. The indications, contraindications, technique, and results of the above procedure should only be interpreted and judged by a Board-Certified Interventional Pain Specialist with extensive familiarity and expertise in the same exact procedure and technique.  Instructions provided at this appointment: Patient Instructions  Epidural Steroid Injection An epidural steroid injection is a shot of steroid medicine and numbing medicine that is given into the space between the spinal cord and the bones in your back (epidural space). The shot helps relieve pain caused by an irritated or swollen nerve root. The amount of pain relief you get from the injection depends on what is causing the nerve to be swollen and irritated, and how long your pain lasts. You are more likely to benefit from this injection if your pain is strong and comes on suddenly rather than if you have had pain for a long time. Tell a health care provider about:  Any allergies you have.  All  medicines you are taking, including vitamins, herbs, eye drops, creams, and over-the-counter medicines.  Any problems you or family members have had with anesthetic medicines.  Any blood disorders you have.  Any surgeries you have had.  Any medical conditions you  have.  Whether you are pregnant or may be pregnant. What are the risks? Generally, this is a safe procedure. However, problems may occur, including:  Headache.  Bleeding.  Infection.  Allergic reaction to medicines.  Damage to your nerves.  What happens before the procedure? Staying hydrated Follow instructions from your health care provider about hydration, which may include:  Up to 2 hours before the procedure - you may continue to drink clear liquids, such as water, clear fruit juice, black coffee, and plain tea.  Eating and drinking restrictions Follow instructions from your health care provider about eating and drinking, which may include:  8 hours before the procedure - stop eating heavy meals or foods such as meat, fried foods, or fatty foods.  6 hours before the procedure - stop eating light meals or foods, such as toast or cereal.  6 hours before the procedure - stop drinking milk or drinks that contain milk.  2 hours before the procedure - stop drinking clear liquids.  Medicine  You may be given medicines to lower anxiety.  Ask your health care provider about: ? Changing or stopping your regular medicines. This is especially important if you are taking diabetes medicines or blood thinners. ? Taking medicines such as aspirin and ibuprofen. These medicines can thin your blood. Do not take these medicines before your procedure if your health care provider instructs you not to. General instructions  Plan to have someone take you home from the hospital or clinic. What happens during the procedure?  You may receive a medicine to help you relax (sedative).  You will be asked to lie on your  abdomen.  The injection site will be cleaned.  A numbing medicine (local anesthetic) will be used to numb the injection site.  A needle will be inserted through your skin into the epidural space. You may feel some discomfort when this happens. An X-ray machine will be used to make sure the needle is put as close as possible to the affected nerve.  A steroid medicine and a local anesthetic will be injected into the epidural space.  The needle will be removed.  A bandage (dressing) will be put over the injection site. What happens after the procedure?  Your blood pressure, heart rate, breathing rate, and blood oxygen level will be monitored until the medicines you were given have worn off.  Your arm or leg may feel weak or numb for a few hours.  The injection site may feel sore.  Do not drive for 24 hours if you received a sedative. This information is not intended to replace advice given to you by your health care provider. Make sure you discuss any questions you have with your health care provider. Document Released: 01/31/2008 Document Revised: 04/06/2016 Document Reviewed: 02/09/2016 Elsevier Interactive Patient Education  2017 [employer].

## 2017-06-07 NOTE — Patient Instructions (Signed)
Epidural Steroid Injection An epidural steroid injection is a shot of steroid medicine and numbing medicine that is given into the space between the spinal cord and the bones in your back (epidural space). The shot helps relieve pain caused by an irritated or swollen nerve root. The amount of pain relief you get from the injection depends on what is causing the nerve to be swollen and irritated, and how long your pain lasts. You are more likely to benefit from this injection if your pain is strong and comes on suddenly rather than if you have had pain for a long time. Tell a health care provider about:  Any allergies you have.  All medicines you are taking, including vitamins, herbs, eye drops, creams, and over-the-counter medicines.  Any problems you or family members have had with anesthetic medicines.  Any blood disorders you have.  Any surgeries you have had.  Any medical conditions you have.  Whether you are pregnant or may be pregnant. What are the risks? Generally, this is a safe procedure. However, problems may occur, including:  Headache.  Bleeding.  Infection.  Allergic reaction to medicines.  Damage to your nerves. What happens before the procedure? Staying hydrated  Follow instructions from your health care provider about hydration, which may include:  Up to 2 hours before the procedure - you may continue to drink clear liquids, such as water, clear fruit juice, black coffee, and plain tea. Eating and drinking restrictions  Follow instructions from your health care provider about eating and drinking, which may include:  8 hours before the procedure - stop eating heavy meals or foods such as meat, fried foods, or fatty foods.  6 hours before the procedure - stop eating light meals or foods, such as toast or cereal.  6 hours before the procedure - stop drinking milk or drinks that contain milk.  2 hours before the procedure - stop drinking clear  liquids. Medicine  You may be given medicines to lower anxiety.  Ask your health care provider about:  Changing or stopping your regular medicines. This is especially important if you are taking diabetes medicines or blood thinners.  Taking medicines such as aspirin and ibuprofen. These medicines can thin your blood. Do not take these medicines before your procedure if your health care provider instructs you not to. General instructions  Plan to have someone take you home from the hospital or clinic. What happens during the procedure?  You may receive a medicine to help you relax (sedative).  You will be asked to lie on your abdomen.  The injection site will be cleaned.  A numbing medicine (local anesthetic) will be used to numb the injection site.  A needle will be inserted through your skin into the epidural space. You may feel some discomfort when this happens. An X-ray machine will be used to make sure the needle is put as close as possible to the affected nerve.  A steroid medicine and a local anesthetic will be injected into the epidural space.  The needle will be removed.  A bandage (dressing) will be put over the injection site. What happens after the procedure?  Your blood pressure, heart rate, breathing rate, and blood oxygen level will be monitored until the medicines you were given have worn off.  Your arm or leg may feel weak or numb for a few hours.  The injection site may feel sore.  Do not drive for 24 hours if you received a sedative. This information   is not intended to replace advice given to you by your health care provider. Make sure you discuss any questions you have with your health care provider. Document Released: 01/31/2008 Document Revised: 04/06/2016 Document Reviewed: 02/09/2016 Elsevier Interactive Patient Education  2017 [employer]  

## 2017-06-08 ENCOUNTER — Telehealth: Payer: Self-pay | Admitting: *Deleted

## 2017-06-08 NOTE — Telephone Encounter (Signed)
No problems post procedure. 

## 2017-06-08 NOTE — Telephone Encounter (Signed)
Spoke with patient denies any problems from procedure.  States she has a slight headache that comes and goes and is using ibuprofen for that.  Otherwise no c/o.  Reported to Dr Holley Raring.

## 2017-06-12 ENCOUNTER — Telehealth: Payer: Self-pay | Admitting: Student in an Organized Health Care Education/Training Program

## 2017-06-12 NOTE — Telephone Encounter (Signed)
Spoke with patient, back pain is back but she is more concerned about headache that has come on over the weekend with a vengance and is accompanied by nausea.  Patient states that she went to work today and is having much difficulty with headache and nausea.  School nurse took her BP and it is elevated with diastolics in the 322'V per patient.  Will speak to Dr Holley Raring as soon as he out of procedure and call patient back with instruction.

## 2017-06-12 NOTE — Telephone Encounter (Signed)
Spoke with patient on August 6 at approximately 11 AM. Patient had called our clinic earlier this morning complaining of a headache and low back pain. Patient is status post a lumbar epidural steroid injection on August 1. Postprocedure day 1 and 2 were uncomplicated, patient stated that her low back pain and leg pain had significantly improved she was participating in more activities around her house without as much pain. She also did not endorse any severe headaches on postprocedure day 1, 2.  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 Her blood pressure today prior to her calling us was also elevated to a diastolic of 416 which was taken at the school.  An extensive discussion with the patient over the phone. She complains of a positional headache that is worse when she stands up and better when she lays supine. This headache started on postprocedure day 3 along with low back spasms. Patient does have symptoms of nausea and vomiting but no vision changes, nuchal rigidity, temperature, extremity weakness.  Discussed various therapeutic options for this patient including rest and observation. I provided her reassurance that if this was indeed a postural puncture headache that he should get better on its own with time. Should her symptoms fail to improve, we did have a discussion about an epidural blood patch. However, at this time I don't think is something that we necessarily have to rush to since we can still try conservative therapy including ibuprofen/Tylenol, hydration, caffeine.  Patient does have our clinic phone number and is instructed to call us if her symptoms worsen or she has any questions or concerns. I'll personally follow up with the patient on Wednesday, August 8.

## 2017-06-12 NOTE — Telephone Encounter (Signed)
Dr Holley Raring called and spoke with patient

## 2017-06-12 NOTE — Telephone Encounter (Signed)
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

## 2017-06-13 ENCOUNTER — Telehealth: Payer: Self-pay | Admitting: Student in an Organized Health Care Education/Training Program

## 2017-06-13 NOTE — Telephone Encounter (Signed)
Spoke with patient who is doing well. States that her low back pain is improved. She is able to walk around her house and also perform activities of daily living without being as uncomfortable.  Patient states that she does not have any headaches. She was able to get bed rest yesterday and when she woke up in the evening she states that her positional headache had changed to more of a achy throb. Today she states she is feeling well, no headaches or back pain.  Patient to follow up with [place] next week. If anything changes with the patient or if she has any questions or concerns, she is welcome to give [place] a call and I provided her with a callback number.

## 2017-06-13 NOTE — Telephone Encounter (Signed)
Left voicemail with patient to call us back today if s/s have not improved.

## 2017-06-20 ENCOUNTER — Encounter: Payer: Self-pay | Admitting: Student in an Organized Health Care Education/Training Program

## 2017-06-20 ENCOUNTER — Ambulatory Visit
Payer: BC Managed Care – PPO | Attending: Student in an Organized Health Care Education/Training Program | Admitting: Student in an Organized Health Care Education/Training Program

## 2017-06-20 VITALS — BP 133/91 | HR 79 | Temp 98.7°F | Resp 16 | Ht 68.0 in | Wt 206.0 lb

## 2017-06-20 DIAGNOSIS — Z823 Family history of stroke: Secondary | ICD-10-CM | POA: Diagnosis not present

## 2017-06-20 DIAGNOSIS — F17210 Nicotine dependence, cigarettes, uncomplicated: Secondary | ICD-10-CM | POA: Insufficient documentation

## 2017-06-20 DIAGNOSIS — Z818 Family history of other mental and behavioral disorders: Secondary | ICD-10-CM | POA: Diagnosis not present

## 2017-06-20 DIAGNOSIS — Z8261 Family history of arthritis: Secondary | ICD-10-CM | POA: Diagnosis not present

## 2017-06-20 DIAGNOSIS — G43809 Other migraine, not intractable, without status migrainosus: Secondary | ICD-10-CM | POA: Diagnosis not present

## 2017-06-20 DIAGNOSIS — Z8249 Family history of ischemic heart disease and other diseases of the circulatory system: Secondary | ICD-10-CM | POA: Diagnosis not present

## 2017-06-20 DIAGNOSIS — Z9889 Other specified postprocedural states: Secondary | ICD-10-CM | POA: Insufficient documentation

## 2017-06-20 DIAGNOSIS — M4306 Spondylolysis, lumbar region: Secondary | ICD-10-CM | POA: Diagnosis not present

## 2017-06-20 DIAGNOSIS — M79604 Pain in right leg: Secondary | ICD-10-CM

## 2017-06-20 DIAGNOSIS — Z841 Family history of disorders of kidney and ureter: Secondary | ICD-10-CM | POA: Insufficient documentation

## 2017-06-20 DIAGNOSIS — M5116 Intervertebral disc disorders with radiculopathy, lumbar region: Secondary | ICD-10-CM | POA: Diagnosis not present

## 2017-06-20 DIAGNOSIS — E669 Obesity, unspecified: Secondary | ICD-10-CM | POA: Insufficient documentation

## 2017-06-20 DIAGNOSIS — Z825 Family history of asthma and other chronic lower respiratory diseases: Secondary | ICD-10-CM | POA: Insufficient documentation

## 2017-06-20 DIAGNOSIS — Z888 Allergy status to other drugs, medicaments and biological substances status: Secondary | ICD-10-CM | POA: Diagnosis not present

## 2017-06-20 DIAGNOSIS — M79605 Pain in left leg: Secondary | ICD-10-CM | POA: Diagnosis not present

## 2017-06-20 DIAGNOSIS — Z811 Family history of alcohol abuse and dependence: Secondary | ICD-10-CM | POA: Diagnosis not present

## 2017-06-20 DIAGNOSIS — M5136 Other intervertebral disc degeneration, lumbar region: Secondary | ICD-10-CM

## 2017-06-20 DIAGNOSIS — Z833 Family history of diabetes mellitus: Secondary | ICD-10-CM | POA: Diagnosis not present

## 2017-06-20 DIAGNOSIS — M549 Dorsalgia, unspecified: Secondary | ICD-10-CM | POA: Diagnosis not present

## 2017-06-20 DIAGNOSIS — M5416 Radiculopathy, lumbar region: Secondary | ICD-10-CM | POA: Diagnosis not present

## 2017-06-20 DIAGNOSIS — Z79899 Other long term (current) drug therapy: Secondary | ICD-10-CM | POA: Diagnosis not present

## 2017-06-20 NOTE — Patient Instructions (Signed)
-  referral to PT -Follow up in 1 month

## 2017-06-20 NOTE — Progress Notes (Signed)
Patient's Name: Rebecca Barker  MRN: [id number]  Referring Provider: Rusty Aus, MD  DOB: [birth year]  PCP: Rusty Aus, MD  DOS: 06/20/2017  Note by: Gillis Santa, MD  Service setting: Ambulatory outpatient  Specialty: Interventional Pain Management  Location: [facility] (AMB) Pain Management Facility    Patient type: Established   Primary Reason(s) for Visit: Encounter for post-procedure evaluation of chronic illness with mild to moderate exacerbation CC: Back Pain (right, lower)  HPI  Rebecca Barker is a 51 y.o. year old, female patient, who comes today for a post-procedure evaluation. She  does not have a problem list on file. Her primarily concern today is the Back Pain (right, lower)  Pain Assessment: Location: Right Back Radiating: right upper leg Onset: More than a month ago Duration: Chronic pain Quality: Aching, Dull Severity: 3 /10 (self-reported pain score)  Note: Reported level is compatible with observation.                   Effect on ADL: unable to exercise, limited in doing household chores, missing work Timing: Constant Modifying factors: medications, TENS, lying down  Rebecca Barker comes in today for post-procedure evaluation after the treatment done on 06/13/2017. Patient had L4-5 interlaminar epidural steroid injection performed. Patient returns today stating that her right leg pain and low back pain is much better. She states that she has been able to walk longer distances without experiencing as much pain. No headaches. 5 out of 5 strength bilateral lower extremity.  Further details on both, my assessment(s), as well as the proposed treatment plan, please see below.  Post-Procedure Assessment  06/13/2017 Procedure:  Pre-procedure: 6 /10 Post-procedure: 0-No pain/10  Influential Factors: BMI: 31.32 kg/m Intra-procedural challenges: None observed.         Assessment challenges: None detected.              Reported side-effects: None.        Post-procedural  adverse reactions or complications: None reported         Sedation: Please see nurses note. When no sedatives are used, the analgesic levels obtained are directly associated to the effectiveness of the local anesthetics. However, when sedation is provided, the level of analgesia obtained during the initial 1 hour following the intervention, is believed to be the result of a combination of factors. These factors may include, but are not limited to: 1. The effectiveness of the local anesthetics used. 2. The effects of the analgesic(s) and/or anxiolytic(s) used. 3. The degree of discomfort experienced by the patient at the time of the procedure. 4. The patients ability and reliability in recalling and recording the events. 5. The presence and influence of possible secondary gains and/or psychosocial factors. Reported result: Relief experienced during the 1st hour after the procedure: 100 % (Ultra-Short Term Relief)            Interpretative annotation: Clinically appropriate result. Analgesia during this period is likely to be Local Anesthetic and/or IV Sedative (Analgesic/Anxiolytic) related.          Effects of local anesthetic: The analgesic effects attained during this period are directly associated to the localized infiltration of local anesthetics and therefore cary significant diagnostic value as to the etiological location, or anatomical origin, of the pain. Expected duration of relief is directly dependent on the pharmacodynamics of the local anesthetic used. Long-acting (4-6 hours) anesthetics used.  Reported result: Relief during the next 4 to 6 hour after the procedure: 100 % (Short-Term  Relief)            Interpretative annotation: Clinically appropriate result. Analgesia during this period is likely to be Local Anesthetic-related.          Long-term benefit: Defined as the period of time past the expected duration of local anesthetics (1 hour for short-acting and 4-6 hours for long-acting).  With the possible exception of prolonged sympathetic blockade from the local anesthetics, benefits during this period are typically attributed to, or associated with, other factors such as analgesic sensory neuropraxia, antiinflammatory effects, or beneficial biochemical changes provided by agents other than the local anesthetics.  Reported result: Extended relief following procedure: 50 % (Long-Term Relief)            Interpretative annotation: Clinically appropriate result. Good relief. No permanent benefit expected. Inflammation plays a part in the etiology to the pain.          Current benefits: Defined as persistent relief that continues at this point in time.   Reported results: Treated area: 75 %       Interpretative annotation: Good relief. Therapeutic success. Effective therapeutic approach.          Interpretation: Results would suggest a successful diagnostic and therapeutic intervention.                  Plan:  Please see "Plan of Care" for details.  Laboratory Chemistry  Inflammation Markers (CRP: Acute Phase) (ESR: Chronic Phase) No results found for: CRP, ESRSEDRATE               Renal Function Markers Lab Results  Component Value Date   BUN 13 06/22/2007   CREATININE 0.82 06/22/2007   GFRAA  06/22/2007    >60        The eGFR has been calculated using the MDRD equation. This calculation has not been validated in all clinical   GFRNONAA >60 06/22/2007                 Hepatic Function Markers Lab Results  Component Value Date   AST 24 06/22/2007   ALT 37 (H) 06/22/2007   ALBUMIN 3.7 06/22/2007   ALKPHOS 86 06/22/2007                 Electrolytes Lab Results  Component Value Date   NA 140 06/22/2007   K 3.5 06/22/2007   CL 99 06/22/2007   CALCIUM 9.7 06/22/2007                 Neuropathy Markers No results found for: [id number]               Bone Pathology Markers Lab Results  Component Value Date   ALKPHOS 86 06/22/2007   CALCIUM 9.7 06/22/2007                  Coagulation Parameters Lab Results  Component Value Date   PLT 479 (H) 06/22/2007                 Cardiovascular Markers Lab Results  Component Value Date   HGB 16.1 (H) 06/22/2007   HCT 46.8 (H) 06/22/2007                 Note: Lab results reviewed.  Recent Diagnostic Imaging Review  Dg C-arm 1-60 Min-no Report  Result Date: 06/07/2017 Fluoroscopy was utilized by the requesting physician.  No radiographic interpretation.   Note: Imaging results reviewed.  Meds   Current Meds  Medication Sig  . buPROPion (WELLBUTRIN XL) 150 MG 24 hr tablet Take 150 mg by mouth daily.  . cyclobenzaprine (FLEXERIL) 10 MG tablet Take 10 mg by mouth as needed for muscle spasms.  . hydrochlorothiazide (HYDRODIURIL) 25 MG tablet Take 25 mg by mouth daily.  Marland Kitchen HYDROcodone-acetaminophen (NORCO/VICODIN) 5-325 MG tablet Take 1 tablet by mouth as needed for moderate pain.  . meloxicam (MOBIC) 7.5 MG tablet Take 7.5 mg by mouth as needed for pain.  . pantoprazole (PROTONIX) 40 MG tablet Take 40 mg by mouth daily.    ROS  Constitutional: Denies any fever or chills Gastrointestinal: No reported hemesis, hematochezia, vomiting, or acute GI distress Musculoskeletal: Denies any acute onset joint swelling, redness, loss of ROM, or weakness Neurological: No reported episodes of acute onset apraxia, aphasia, dysarthria, agnosia, amnesia, paralysis, loss of coordination, or loss of consciousness  Allergies  Rebecca Barker is allergic to betadine [povidone iodine].  Rebecca Barker  Drug: Rebecca Barker  has no drug history on file. Alcohol:  has no alcohol history on file. Tobacco:  reports that she has been smoking Cigarettes.  She started smoking about 15 years ago. She has a 7.50 pack-year smoking history. She has never used smokeless tobacco. Medical:  has a past medical history of Migraines and Peptic ulcer. Surgical: Rebecca Barker  has a past surgical history that includes Foot surgery (1980); Cesarean  section (1999 and 2001); laparoscopy; and Cervical fusion (2008). Family: family history includes Alcohol abuse in her paternal grandfather; Arthritis in her maternal grandmother, mother, and paternal grandmother; Birth defects in her brother; COPD in her father; Cancer in her paternal grandmother; Diabetes in her maternal grandfather; Hypertension in her mother; Kidney disease in her maternal grandmother; Learning disabilities in her brother; Mental retardation in her brother; Miscarriages / Stillbirths in her mother; Stroke in her maternal grandfather and maternal grandmother; Varicose Veins in her mother.  Constitutional Exam  General appearance: Well nourished, well developed, and well hydrated. In no apparent acute distress Vitals:   06/20/17 1039  BP: (!) 133/91  Pulse: 79  Resp: 16  Temp: 98.7 F (37.1 C)  TempSrc: Oral  SpO2: 98%  Weight: 206 lb (93.4 kg)  Height: _0  (1.727 m)   BMI Assessment: Estimated body mass index is 31.32 kg/m as calculated from the following:   Height as of this encounter: _1  (1.727 m).   Weight as of this encounter: 206 lb (93.4 kg).  BMI interpretation table: BMI level Category Range association with higher incidence of chronic pain  <18 kg/m2 Underweight   18.5-24.9 kg/m2 Ideal body weight   25-29.9 kg/m2 Overweight Increased incidence by 20%  30-34.9 kg/m2 Obese (Class I) Increased incidence by 68%  35-39.9 kg/m2 Severe obesity (Class II) Increased incidence by 136%  >40 kg/m2 Extreme obesity (Class III) Increased incidence by 254%   BMI Readings from Last 4 Encounters:  06/20/17 31.32 kg/m  06/07/17 31.32 kg/m   Wt Readings from Last 4 Encounters:  06/20/17 206 lb (93.4 kg)  06/07/17 206 lb (93.4 kg)  Psych/Mental status: Alert, oriented x 3 (person, place, & time)       Eyes: PERLA Respiratory: No evidence of acute respiratory distress  Cervical Spine Area Exam  Skin & Axial Inspection: No masses, redness, edema, swelling, or  associated skin lesions Alignment: Symmetrical Functional ROM: Unrestricted ROM      Stability: No instability detected Muscle Tone/Strength: Functionally intact. No obvious neuro-muscular anomalies detected. Sensory (Neurological):  Unimpaired Palpation: No palpable anomalies              Upper Extremity (UE) Exam    Side: Right upper extremity  Side: Left upper extremity  Skin & Extremity Inspection: Skin color, temperature, and hair growth are WNL. No peripheral edema or cyanosis. No masses, redness, swelling, asymmetry, or associated skin lesions. No contractures.  Skin & Extremity Inspection: Skin color, temperature, and hair growth are WNL. No peripheral edema or cyanosis. No masses, redness, swelling, asymmetry, or associated skin lesions. No contractures.  Functional ROM: Unrestricted ROM          Functional ROM: Unrestricted ROM          Muscle Tone/Strength: Functionally intact. No obvious neuro-muscular anomalies detected.  Muscle Tone/Strength: Functionally intact. No obvious neuro-muscular anomalies detected.  Sensory (Neurological): Unimpaired  Sensory (Neurological): Unimpaired  Palpation: No palpable anomalies              Palpation: No palpable anomalies              Specialized Test(s): Deferred         Specialized Test(s): Deferred          Thoracic Spine Area Exam  Skin & Axial Inspection: No masses, redness, or swelling Alignment: Symmetrical Functional ROM: Unrestricted ROM Stability: No instability detected Muscle Tone/Strength: Functionally intact. No obvious neuro-muscular anomalies detected. Sensory (Neurological): Unimpaired Muscle strength & Tone: No palpable anomalies  Lumbar Spine Area Exam  Skin & Axial Inspection: No masses, redness, or swelling Alignment: Symmetrical Functional ROM: Unrestricted ROM      Stability: No instability detected Muscle Tone/Strength: Functionally intact. No obvious neuro-muscular anomalies detected. Sensory (Neurological):  Unimpaired Palpation: No palpable anomalies       Provocative Tests: Lumbar Hyperextension and rotation test: Negative       Lumbar Lateral bending test: Negative       Patrick's Maneuver: Negative                    Gait & Posture Assessment  Ambulation: Unassisted Gait: Relatively normal for age and body habitus Posture: WNL   Lower Extremity Exam    Side: Right lower extremity  Side: Left lower extremity  Skin & Extremity Inspection: Skin color, temperature, and hair growth are WNL. No peripheral edema or cyanosis. No masses, redness, swelling, asymmetry, or associated skin lesions. No contractures.  Skin & Extremity Inspection: Skin color, temperature, and hair growth are WNL. No peripheral edema or cyanosis. No masses, redness, swelling, asymmetry, or associated skin lesions. No contractures.  Functional ROM: Unrestricted ROM          Functional ROM: Unrestricted ROM          Muscle Tone/Strength: Functionally intact. No obvious neuro-muscular anomalies detected.  Muscle Tone/Strength: Functionally intact. No obvious neuro-muscular anomalies detected.  Sensory (Neurological): Unimpaired  Sensory (Neurological): Unimpaired  Palpation: No palpable anomalies  Palpation: No palpable anomalies   Assessment  Primary Diagnosis & Pertinent Problem List: The primary encounter diagnosis was Lumbar radiculopathy. Diagnoses of Lumbar spondylolysis, Pain in both lower extremities, Other migraine without status migrainosus, not intractable, Lumbar degenerative disc disease, and Obesity, unspecified classification, unspecified obesity type, unspecified whether serious comorbidity present were also pertinent to this visit.  Status Diagnosis  Improving Controlled Controlled 1. Lumbar radiculopathy   2. Lumbar spondylolysis   3. Pain in both lower extremities   4. Other migraine without status migrainosus, not intractable   5. Lumbar degenerative disc disease  6. Obesity, unspecified  classification, unspecified obesity type, unspecified whether serious comorbidity present     Problems updated and reviewed during this visit: No problems updated. Plan of Care   Rebecca Barker comes in today for post-procedure evaluation after the treatment done on 06/13/2017. Patient had L4-5 interlaminar epidural steroid injection performed. Patient returns today stating that her right leg pain and low back pain is much better. She states that she has been able to walk longer distances without experiencing as much pain. No headaches. 5 out of 5 strength bilateral lower extremity.  Plan: #1. Successful therapeutic benefit from epidural steroid injection, can consider repeating in the future. #2. Referral to physical therapy with focus on McKenzie spine rehabilitation #3. Follow up with me in 1 month  Lab-work, procedure(s), and/or referral(s): Orders Placed This Encounter  Procedures  . Ambulatory referral to Physical Therapy    Pharmacological management options:  Opioid Analgesics: We'll take over management today. See above orders Membrane stabilizer: We have discussed the possibility of optimizing this mode of therapy, if tolerated Muscle relaxant: We have discussed the possibility of a trial NSAID: We have discussed the possibility of a trial Other analgesic(s): To be determined at a later time   Interventional management options: Planned, scheduled, and/or pending:    N/A    Considering:   -Repeat interlaminar L4-L5 epidural steroid injection    PRN Procedures:   To be determined at a later time   Provider-requested follow-up: Return in about 1 month (around 07/21/2017).  Future Appointments Date Time Provider [place]  07/20/2017 2:30 PM Gillis Santa, MD [facility] None    Primary Care Physician: Rusty Aus, MD Location: [facility] Note by: Gillis Santa, M.D Date: 06/20/2017; Time: 3:22 PM  Patient Instructions  -referral to  PT -Follow up in 1 month

## 2017-06-20 NOTE — Progress Notes (Signed)
Safety precautions to be maintained throughout the outpatient stay will include: orient to surroundings, keep bed in low position, maintain call bell within reach at all times, provide assistance with transfer out of bed and ambulation.  

## 2017-07-19 ENCOUNTER — Ambulatory Visit: Payer: BC Managed Care – PPO | Admitting: Student in an Organized Health Care Education/Training Program

## 2017-07-20 ENCOUNTER — Ambulatory Visit: Payer: BC Managed Care – PPO | Admitting: Student in an Organized Health Care Education/Training Program

## 2017-08-01 ENCOUNTER — Ambulatory Visit
Payer: BC Managed Care – PPO | Attending: Student in an Organized Health Care Education/Training Program | Admitting: Student in an Organized Health Care Education/Training Program

## 2017-08-01 ENCOUNTER — Encounter: Payer: Self-pay | Admitting: Student in an Organized Health Care Education/Training Program

## 2017-08-01 VITALS — BP 219/90 | HR 75 | Temp 98.5°F | Resp 16 | Ht 67.0 in | Wt 205.0 lb

## 2017-08-01 DIAGNOSIS — M549 Dorsalgia, unspecified: Secondary | ICD-10-CM | POA: Insufficient documentation

## 2017-08-01 DIAGNOSIS — M5416 Radiculopathy, lumbar region: Secondary | ICD-10-CM

## 2017-08-01 DIAGNOSIS — F17210 Nicotine dependence, cigarettes, uncomplicated: Secondary | ICD-10-CM | POA: Diagnosis not present

## 2017-08-01 DIAGNOSIS — Z809 Family history of malignant neoplasm, unspecified: Secondary | ICD-10-CM | POA: Insufficient documentation

## 2017-08-01 DIAGNOSIS — M5116 Intervertebral disc disorders with radiculopathy, lumbar region: Secondary | ICD-10-CM | POA: Insufficient documentation

## 2017-08-01 DIAGNOSIS — Z8249 Family history of ischemic heart disease and other diseases of the circulatory system: Secondary | ICD-10-CM | POA: Diagnosis not present

## 2017-08-01 DIAGNOSIS — M79604 Pain in right leg: Secondary | ICD-10-CM | POA: Diagnosis not present

## 2017-08-01 DIAGNOSIS — Z833 Family history of diabetes mellitus: Secondary | ICD-10-CM | POA: Diagnosis not present

## 2017-08-01 DIAGNOSIS — G8929 Other chronic pain: Secondary | ICD-10-CM | POA: Insufficient documentation

## 2017-08-01 DIAGNOSIS — Z5181 Encounter for therapeutic drug level monitoring: Secondary | ICD-10-CM | POA: Diagnosis not present

## 2017-08-01 DIAGNOSIS — M545 Low back pain: Secondary | ICD-10-CM | POA: Insufficient documentation

## 2017-08-01 DIAGNOSIS — M5136 Other intervertebral disc degeneration, lumbar region: Secondary | ICD-10-CM

## 2017-08-01 DIAGNOSIS — M4306 Spondylolysis, lumbar region: Secondary | ICD-10-CM

## 2017-08-01 DIAGNOSIS — M5137 Other intervertebral disc degeneration, lumbosacral region: Secondary | ICD-10-CM | POA: Diagnosis not present

## 2017-08-01 DIAGNOSIS — M48061 Spinal stenosis, lumbar region without neurogenic claudication: Secondary | ICD-10-CM | POA: Insufficient documentation

## 2017-08-01 DIAGNOSIS — Z823 Family history of stroke: Secondary | ICD-10-CM | POA: Diagnosis not present

## 2017-08-01 DIAGNOSIS — Z79899 Other long term (current) drug therapy: Secondary | ICD-10-CM | POA: Diagnosis not present

## 2017-08-01 DIAGNOSIS — M79605 Pain in left leg: Secondary | ICD-10-CM

## 2017-08-01 NOTE — Patient Instructions (Signed)
It was nice seeing you today. I'm glad to see that you're doing better.  I encourage you to participate in physical therapy and then do those exercises at home.  Exercises can be directed towards your lower lumbar spine specifically L3, L4-L5 as well as your SI joint You can discuss McKenzie techniques with your therapist.  Follow-up in 8-10 weeks or earlier if symptoms return.

## 2017-08-01 NOTE — Progress Notes (Signed)
Safety precautions to be maintained throughout the outpatient stay will include: orient to surroundings, keep bed in low position, maintain call bell within reach at all times, provide assistance with transfer out of bed and ambulation.  

## 2017-08-01 NOTE — Progress Notes (Signed)
Patient's Name: Rebecca Barker  MRN: [id number]  Referring Provider: Rusty Aus, MD  DOB: [birth year]  PCP: Rusty Aus, MD  DOS: 08/01/2017  Note by: Gillis Santa, MD  Service setting: Ambulatory outpatient  Specialty: Interventional Pain Management  Location: [facility] (AMB) Pain Management Facility    Patient type: Established   Primary Reason(s) for Visit: Encounter for prescription drug management. (Level of risk: moderate)  CC: Back Pain (right, lower)  HPI  [name] is a 51 y.o. year old, female patient, who comes today for a medication management evaluation. She  does not have a problem list on file. Her primarily concern today is the Back Pain (right, lower)  Pain Assessment: Location: Right, Lower Back Radiating: entire right leg Onset: More than a month ago Duration: Chronic pain Quality: Aching, Dull Severity: 0-No pain/10 (self-reported pain score)  Note: Reported level is compatible with observation.                   When using our objective Pain Scale, any level above a 5/10 is said to belong in an emergency room, as it progressively worsens from a 6/10, which is described as severely limiting. Requiring emergency care not usually available at an outpatient pain management facility. Communication becomes difficult and requires great effort. Assistance to reach the emergency department may be required. Facial flushing and profuse sweating along with potentially dangerous increases in heart rate and blood pressure will be evident. 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 She states  that the sharp shooting pain into her right leg is much better.she is able to walk longer periods of time with fewer painful episodes. She is able to participate in family activities and activities of daily living.  Patient is going to start physical therapy today, 2 times a week for 3-4 weeks. She is looking forward to this.   The patient  has no drug history on file. Her body mass index is 32.11 kg/m.  Further details on both, my assessment(s), as well as the proposed treatment plan, please see below.  Controlled Substance Pharmacotherapy Assessment REMS (Risk Evaluation and Mitigation Strategy)  Analgesic: 06/21/2017 1 06/21/2017 HYDROCODONE-ACETAMIN 5-325 MG 75 18 Ma Mil [id number] KDX(8338) 0 20.83 MME Comm Ins [place] Med management per PCP.  Landis Martins, RN  08/01/2017  8:39 AM  Sign at close encounter Safety precautions to be maintained throughout the outpatient stay will include: orient to surroundings, keep bed in low position, maintain call bell within reach at all times, provide assistance with transfer out of bed and ambulation.    Pharmacokinetics: Liberation and absorption (onset of action): WNL Distribution (time to peak effect): WNL Metabolism and excretion (duration of action): WNL         Pharmacodynamics: Desired effects: Analgesia: [name] reports >50% benefit. Functional ability: Patient reports that medication allows her to accomplish basic ADLs Clinically meaningful improvement in function (CMIF): Sustained CMIF goals met Perceived effectiveness: Described as relatively effective, allowing for increase in activities of daily living (ADL) Undesirable effects: Side-effects or Adverse reactions: None reported Risk Assessment Profile: Aberrant behavior: See prior evaluations. None observed or detected  today Comorbid factors increasing risk of overdose: See prior notes. No additional risks detected today Risk of substance use disorder (SUD): Low     Opioid Risk  Tool - 06/07/17 1047      Family History of Substance Abuse   Alcohol Positive Female   Illegal Drugs Negative   Rx Drugs Negative     Personal History of Substance Abuse   Alcohol Negative   Illegal Drugs Negative   Rx Drugs Negative     Age   Age between 49-45 years  No     History of Preadolescent Sexual Abuse   History of Preadolescent Sexual Abuse Negative or Female     Psychological Disease   Psychological Disease Positive   Depression Positive     Total Score   Opioid Risk Tool Scoring 4   Opioid Risk Interpretation Moderate Risk     ORT Scoring interpretation table:  Score <3 = Low Risk for SUD  Score between 4-7 = Moderate Risk for SUD  Score >8 = High Risk for Opioid Abuse   Pharmacologic Plan: No change in therapy, at this time continue medication management with PCP.  Laboratory Chemistry  Inflammation Markers (CRP: Acute Phase) (ESR: Chronic Phase) No results found for: CRP, ESRSEDRATE               Renal Function Markers Lab Results  Component Value Date   BUN 13 06/22/2007   CREATININE 0.82 06/22/2007   GFRAA  06/22/2007    >60        The eGFR has been calculated using the MDRD equation. This calculation has not been validated in all clinical   GFRNONAA >60 06/22/2007                 Hepatic Function Markers Lab Results  Component Value Date   AST 24 06/22/2007   ALT 37 (H) 06/22/2007   ALBUMIN 3.7 06/22/2007   ALKPHOS 86 06/22/2007                 Electrolytes Lab Results  Component Value Date   NA 140 06/22/2007   K 3.5 06/22/2007   CL 99 06/22/2007   CALCIUM 9.7 06/22/2007                 Neuropathy Markers No results found for: [id number]               Bone Pathology Markers Lab Results  Component Value Date   ALKPHOS 86 06/22/2007   CALCIUM 9.7 06/22/2007                 Coagulation Parameters Lab Results  Component Value Date   PLT 479 (H) 06/22/2007                 Cardiovascular Markers Lab Results   Component Value Date   HGB 16.1 (H) 06/22/2007   HCT 46.8 (H) 06/22/2007                 Note: Lab results reviewed.  Recent Diagnostic Imaging Results  DG C-Arm 1-60 Min-No Report Fluoroscopy was utilized by the requesting physician.  No radiographic  interpretation.   Note: Imaging results reviewed.          Meds   Current Outpatient Prescriptions:  .  buPROPion (WELLBUTRIN XL) 150 MG 24 hr tablet, Take 150 mg by mouth daily., Disp: , Rfl:  .  cyclobenzaprine (FLEXERIL) 10 MG tablet, Take 10 mg by mouth as needed for  muscle spasms., Disp: , Rfl:  .  hydrochlorothiazide (HYDRODIURIL) 25 MG tablet, Take 25 mg by mouth daily., Disp: , Rfl:  .  HYDROcodone-acetaminophen (NORCO/VICODIN) 5-325 MG tablet, Take 1 tablet by mouth as needed for moderate pain., Disp: , Rfl:  .  meloxicam (MOBIC) 7.5 MG tablet, Take 7.5 mg by mouth as needed for pain., Disp: , Rfl:  .  pantoprazole (PROTONIX) 40 MG tablet, Take 40 mg by mouth daily., Disp: , Rfl:   ROS  Constitutional: Denies any fever or chills Gastrointestinal: No reported hemesis, hematochezia, vomiting, or acute GI distress Musculoskeletal: Denies any acute onset joint swelling, redness, loss of ROM, or weakness Neurological: No reported episodes of acute onset apraxia, aphasia, dysarthria, agnosia, amnesia, paralysis, loss of coordination, or loss of consciousness  Allergies  Ms. Obi is allergic to betadine [povidone iodine].  [place]  Drug: Ms. Franson  has no drug history on file. Alcohol:  has no alcohol history on file. Tobacco:  reports that she has been smoking Cigarettes.  She started smoking about 15 years ago. She has a 7.50 pack-year smoking history. She has never used smokeless tobacco. Medical:  has a past medical history of Migraines and Peptic ulcer. Surgical: Ms. Jezewski  has a past surgical history that includes Foot surgery (1980); Cesarean section (1999 and 2001); laparoscopy; and Cervical fusion (2008). Family:  family history includes Alcohol abuse in her paternal grandfather; Arthritis in her maternal grandmother, mother, and paternal grandmother; Birth defects in her brother; COPD in her father; Cancer in her paternal grandmother; Diabetes in her maternal grandfather; Hypertension in her mother; Kidney disease in her maternal grandmother; Learning disabilities in her brother; Mental retardation in her brother; Miscarriages / Stillbirths in her mother; Stroke in her maternal grandfather and maternal grandmother; Varicose Veins in her mother.  Constitutional Exam  General appearance: Well nourished, well developed, and well hydrated. In no apparent acute distress Vitals:   08/01/17 0838  BP: (!) 219/90  Pulse: 75  Resp: 16  Temp: 98.5 F (36.9 C)  TempSrc: Oral  SpO2: 98%  Weight: 205 lb (93 kg)  Height: '5\' 7"'  (1.702 m)   BMI Assessment: Estimated body mass index is 32.11 kg/m as calculated from the following:   Height as of this encounter: '5\' 7"'  (1.702 m).   Weight as of this encounter: 205 lb (93 kg).  BMI interpretation table: BMI level Category Range association with higher incidence of chronic pain  <18 kg/m2 Underweight   18.5-24.9 kg/m2 Ideal body weight   25-29.9 kg/m2 Overweight Increased incidence by 20%  30-34.9 kg/m2 Obese (Class I) Increased incidence by 68%  35-39.9 kg/m2 Severe obesity (Class II) Increased incidence by 136%  >40 kg/m2 Extreme obesity (Class III) Increased incidence by 254%   BMI Readings from Last 4 Encounters:  08/01/17 32.11 kg/m  06/20/17 31.32 kg/m  06/07/17 31.32 kg/m   Wt Readings from Last 4 Encounters:  08/01/17 205 lb (93 kg)  06/20/17 206 lb (93.4 kg)  06/07/17 206 lb (93.4 kg)  Psych/Mental status: Alert, oriented x 3 (person, place, & time)       Eyes: PERLA Respiratory: No evidence of acute respiratory distress  Cervical Spine Area Exam  Skin & Axial Inspection: No masses, redness, edema, swelling, or associated skin  lesions Alignment: Symmetrical Functional ROM: Unrestricted ROM      Stability: No instability detected Muscle Tone/Strength: Functionally intact. No obvious neuro-muscular anomalies detected. Sensory (Neurological): Unimpaired Palpation: No palpable anomalies  Upper Extremity (UE) Exam    Side: Right upper extremity  Side: Left upper extremity  Skin & Extremity Inspection: Skin color, temperature, and hair growth are WNL. No peripheral edema or cyanosis. No masses, redness, swelling, asymmetry, or associated skin lesions. No contractures.  Skin & Extremity Inspection: Skin color, temperature, and hair growth are WNL. No peripheral edema or cyanosis. No masses, redness, swelling, asymmetry, or associated skin lesions. No contractures.  Functional ROM: Unrestricted ROM          Functional ROM: Unrestricted ROM          Muscle Tone/Strength: Functionally intact. No obvious neuro-muscular anomalies detected.  Muscle Tone/Strength: Functionally intact. No obvious neuro-muscular anomalies detected.  Sensory (Neurological): Unimpaired          Sensory (Neurological): Unimpaired          Palpation: No palpable anomalies              Palpation: No palpable anomalies              Specialized Test(s): Deferred         Specialized Test(s): Deferred          Thoracic Spine Area Exam  Skin & Axial Inspection: No masses, redness, or swelling Alignment: Symmetrical Functional ROM: Unrestricted ROM Stability: No instability detected Muscle Tone/Strength: Functionally intact. No obvious neuro-muscular anomalies detected. Sensory (Neurological): Unimpaired Muscle strength & Tone: No palpable anomalies  Lumbar Spine Area Exam  Skin & Axial Inspection: No masses, redness, or swelling Alignment: Symmetrical Functional ROM: Unrestricted ROM      Stability: No instability detected Muscle Tone/Strength: Functionally intact. No obvious neuro-muscular anomalies detected. Sensory (Neurological):  Unimpaired Palpation: No palpable anomalies       Provocative Tests: Lumbar Hyperextension and rotation test: Improved after treatment       Lumbar Lateral bending test: evaluation deferred today       Patrick's Maneuver: evaluation deferred today                    Gait & Posture Assessment  Ambulation: Unassisted Gait: Relatively normal for age and body habitus Posture: WNL   Lower Extremity Exam    Side: Right lower extremity  Side: Left lower extremity  Skin & Extremity Inspection: Skin color, temperature, and hair growth are WNL. No peripheral edema or cyanosis. No masses, redness, swelling, asymmetry, or associated skin lesions. No contractures.  Skin & Extremity Inspection: Skin color, temperature, and hair growth are WNL. No peripheral edema or cyanosis. No masses, redness, swelling, asymmetry, or associated skin lesions. No contractures.  Functional ROM: Unrestricted ROM          Functional ROM: Unrestricted ROM          Muscle Tone/Strength: Functionally intact. No obvious neuro-muscular anomalies detected.  Muscle Tone/Strength: Functionally intact. No obvious neuro-muscular anomalies detected.  Sensory (Neurological): Unimpaired  Sensory (Neurological): Unimpaired  Palpation: No palpable anomalies  Palpation: No palpable anomalies   Assessment  Primary Diagnosis & Pertinent Problem List: The primary encounter diagnosis was Lumbar radiculopathy. Diagnoses of Lumbar spondylolysis, Pain in both lower extremities, Lumbar degenerative disc disease, and Spinal stenosis of lumbar region without neurogenic claudication were also pertinent to this visit.  Status Diagnosis  Improved Stable Improved 1. Lumbar radiculopathy   2. Lumbar spondylolysis   3. Pain in both lower extremities   4. Lumbar degenerative disc disease   5. 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 She states that the sharp shooting pain into her right leg is much better.she is able to walk longer periods of time with fewer painful episodes. She is able to participate in family activities and activities of daily living.  Patient is going to start physical therapy today, 2 times a week for 3-4 weeks. She is looking forward to this.  Plan: -Follow up with me in 8-10 weeks or earlier if symptoms return. -physical therapy starting today focusing on lower lumbar spine and sacroiliac joint. Recommend McKenzie techniques.  Future: -Continue to have right hip pain, can consider right L3, L4, L5 facet medial branch nerve block with goal radio frequency ablation or right sacroiliac joint injection area -consider right hip and SI joint x-rays and worsening right hip pain.  Plan of Care  Pharmacotherapy (Medications Ordered): No orders of the defined types were placed in this encounter.  Lab-work, procedure(s), and/or referral(s): No orders of the defined types were placed in this encounter.  Interventional management options: Planned, scheduled, and/or pending:    -right L3, L4, L5 medial branch nerve block with greater frequency ablation -Right SI joint injection -Trigger point injection   PRN Procedures:   To be determined at a later time   Provider-requested follow-up: Return in about 9 weeks (around 10/03/2017) for Medication Management.  No future appointments.  Primary Care Physician: Rusty Aus, MD Location: [facility] Note by: Gillis Santa, M.D Date: 08/01/2017; Time: 9:05 AM  Patient Instructions  It was nice seeing you today. I'm glad to see that you're doing better.  I  encourage you to participate in physical therapy and then do those exercises at home.  Exercises can be directed towards your lower lumbar spine specifically L3, L4-L5 as well as your SI joint You can discuss McKenzie techniques with your therapist.  Follow-up in 8-10 weeks or earlier if symptoms return.

## 2018-01-03 ENCOUNTER — Ambulatory Visit
Payer: BC Managed Care – PPO | Attending: Student in an Organized Health Care Education/Training Program | Admitting: Student in an Organized Health Care Education/Training Program

## 2018-01-03 ENCOUNTER — Encounter: Payer: Self-pay | Admitting: Student in an Organized Health Care Education/Training Program

## 2018-01-03 ENCOUNTER — Other Ambulatory Visit: Payer: Self-pay

## 2018-01-03 VITALS — BP 148/91 | HR 91 | Temp 98.7°F | Resp 18 | Ht 68.0 in | Wt 210.0 lb

## 2018-01-03 DIAGNOSIS — Z79899 Other long term (current) drug therapy: Secondary | ICD-10-CM | POA: Insufficient documentation

## 2018-01-03 DIAGNOSIS — M5416 Radiculopathy, lumbar region: Secondary | ICD-10-CM

## 2018-01-03 DIAGNOSIS — M545 Low back pain: Secondary | ICD-10-CM | POA: Diagnosis present

## 2018-01-03 DIAGNOSIS — F17210 Nicotine dependence, cigarettes, uncomplicated: Secondary | ICD-10-CM | POA: Diagnosis not present

## 2018-01-03 DIAGNOSIS — M5136 Other intervertebral disc degeneration, lumbar region: Secondary | ICD-10-CM | POA: Diagnosis not present

## 2018-01-03 DIAGNOSIS — M48061 Spinal stenosis, lumbar region without neurogenic claudication: Secondary | ICD-10-CM | POA: Insufficient documentation

## 2018-01-03 DIAGNOSIS — E669 Obesity, unspecified: Secondary | ICD-10-CM | POA: Insufficient documentation

## 2018-01-03 DIAGNOSIS — M79604 Pain in right leg: Secondary | ICD-10-CM | POA: Diagnosis present

## 2018-01-03 DIAGNOSIS — M2578 Osteophyte, vertebrae: Secondary | ICD-10-CM | POA: Diagnosis not present

## 2018-01-03 DIAGNOSIS — M5116 Intervertebral disc disorders with radiculopathy, lumbar region: Secondary | ICD-10-CM | POA: Diagnosis not present

## 2018-01-03 NOTE — Progress Notes (Signed)
Patient's Name: Rebecca Barker  MRN: [id number]  Referring Provider: Rusty Aus, MD  DOB: 11-17-1965  PCP: Rusty Aus, MD  DOS: 01/03/2018  Note by: Gillis Santa, MD  Service setting: Ambulatory outpatient  Specialty: Interventional Pain Management  Location: [facility] (AMB) Pain Management Facility    Patient type: Established   Primary Reason(s) for Visit: Evaluation of chronic illnesses with exacerbation, or progression (Level of risk: moderate) CC: Back Pain (low) and Leg Pain (right)  HPI  Rebecca Barker is a 52 y.o. year old, female patient, who comes today for a follow-up evaluation. She has Lumbar radiculopathy; Lumbar degenerative disc disease; and Spinal stenosis of lumbar region without neurogenic claudication on their problem list. Ms. Rebecca Barker was last seen on 08/01/2017. Her primarily concern today is the Back Pain (low) and Leg Pain (right)  Pain Assessment: Location: Lower Back Radiating: right leg Onset: More than a month ago Duration: Chronic pain Quality: Aching(Catching, stiff) Severity: 5 /10 (self-reported pain score)  Note: Reported level is compatible with observation.                         When using our objective Pain Scale, levels between 6 and 10/10 are said to belong in an emergency room, as it progressively worsens from a 6/10, described as severely limiting, requiring emergency care not usually available at an outpatient pain management facility. At a 6/10 level, communication becomes difficult and requires great effort. Assistance to reach the emergency department may be required. Facial flushing and profuse sweating along with potentially dangerous increases in heart rate and blood pressure will be evident. Effect on ADL:   Timing: Intermittent Modifying factors: movement, NSAIDS, vicodin  52 year old female with a history of right>left lumbosacral radiculopathy status post L4-L5 epidural steroid injection performed on June 07, 2017 who had significant  improvement of her radicular symptoms after the epidural.  Patient went on to participate in physical therapy and has been doing exercises at home.  She states that over the last 2-3 weeks she has noticed return of her right lower extremity pain.  Patient states that it is similar in quality to how she felt prior to her previous epidural steroid injection.  No bowel or bladder dysfunction.  No lower extremity weakness  Further details on both, my assessment(s), as well as the proposed treatment plan, please see below.  Laboratory Chemistry  Inflammation Markers (CRP: Acute Phase) (ESR: Chronic Phase) No results found for: CRP, ESRSEDRATE, LATICACIDVEN                       Rheumatology Markers No results found for: RF, ANA, LABURIC, URICUR, LYMEIGGIGMAB, [facility]              Renal Function Markers Lab Results  Component Value Date   BUN 13 06/22/2007   CREATININE 0.82 06/22/2007   GFRAA  06/22/2007    >60        The eGFR has been calculated using the MDRD equation. This calculation has not been validated in all clinical   GFRNONAA >60 06/22/2007                 Hepatic Function Markers Lab Results  Component Value Date   AST 24 06/22/2007   ALT 37 (H) 06/22/2007   ALBUMIN 3.7 06/22/2007   ALKPHOS 86 06/22/2007                 Electrolytes Lab  Results  Component Value Date   NA 140 06/22/2007   K 3.5 06/22/2007   CL 99 06/22/2007   CALCIUM 9.7 06/22/2007                        Neuropathy Markers No results found for: VITAMINB12, FOLATE, HGBA1C, HIV               Bone Pathology Markers No results found for: VD25OH, [id number], FX8329VB1, YO0600KH9, 25OHVITD1, 25OHVITD2, 25OHVITD3, TESTOFREE, TESTOSTERONE                       Coagulation Parameters Lab Results  Component Value Date   PLT 479 (H) 06/22/2007                 Cardiovascular Markers Lab Results  Component Value Date   HGB 16.1 (H) 06/22/2007   HCT 46.8 (H) 06/22/2007                 CA  Markers No results found for: CEA, CA125, LABCA2               Note: Lab results reviewed.  Recent Diagnostic Imaging Review   Cervical DG 1 view:  Results for orders placed during the hospital encounter of 06/26/07  DG Cervical Spine 1 View   Narrative Clinical Data: ACDF, HNP. CERVICAL SPINE - 1 VIEW: Comparison: None. Findings: Anterior cervical discectomy fusion hardware is seen from C5 through probable T1 level (inferior cervical spine less well visualized due to shoulders).  Posterior vertebral alignment is normally maintained. IMPRESSION:  Anterior cervical discectomy fusion hardware from C5 through probable T1.  Provider: Burnett Sheng, Tyson Dense, John Strider   Cervical DG 2-3 views:  Results for orders placed during the hospital encounter of 06/08/10  DG Cervical Spine 2-3 Views   Narrative Clinical Data: Cervical spine surgery in 2008, some neck pain   CERVICAL SPINE - 2-3 VIEW   Comparison: Cervical spine films of 08/15/2007   Findings: The cervical vertebrae are straightened in alignment. There does appear to be solid bony fusion from C5-C7 with no change in position of the anterior metallic fixation plate.  Through flexion and extension there is limited range of motion with no malalignment noted.   IMPRESSION: Straightened alignment.  Solid fusion from C5-C7.  Limited range of motion.  Provider: Pearla Dubonnet    Lumbosacral Imaging: Lumbar MR wo contrast:  Results for orders placed during the hospital encounter of 05/16/17  MR LUMBAR SPINE WO CONTRAST   Narrative CLINICAL DATA:  Initial evaluation for right-sided low back pain radiating into right lower extremity for 3 weeks.  EXAM: MRI LUMBAR SPINE WITHOUT CONTRAST  TECHNIQUE: Multiplanar, multisequence MR imaging of the lumbar spine was performed. No intravenous contrast was administered.  COMPARISON:  Prior MRI from 11/07/2011.  FINDINGS: Segmentation: Normal segmentation. Lowest well-formed  disc is labeled the L5-S1 level.  Alignment: Trace retrolisthesis of L3 on L4. Vertebral bodies otherwise normally aligned with preservation of the normal lumbar lordosis.  Vertebrae: Vertebral body heights are maintained. No evidence for acute or chronic fracture. Few small benign hemangiomas noted. Chronic reactive endplate changes noted about the L5-S1 interspace. Signal intensity within the vertebral body bone marrow otherwise normal. No worrisome osseous lesions. No abnormal marrow edema.  Conus medullaris: Extends to the L1 level and appears normal.  Paraspinal and other soft tissues: Paraspinous soft tissues within normal limits. Visualized visceral structures are normal.  Disc levels:  L1-2: Mild circumferential disc bulge with disc desiccation. Small posterior annular fissure noted. No canal or foraminal stenosis.  L2-3:  Unremarkable.  L3-4: Diffuse circumferential disc bulge with disc desiccation. Mild facet and ligamentum flavum hypertrophy. No significant canal stenosis. Mild bilateral L3 foraminal narrowing.  L4-5:  Normal interspace.  Mild facet hypertrophy.  No stenosis.  L5-S1: Chronic degenerative intervertebral disc space narrowing with reactive endplate changes and marginal endplate osteophytes. Resultant shallow posterior disc osteophyte complex mildly indents the ventral thecal sac without significant stenosis. No neural impingement. Mild bilateral L5 foraminal narrowing.  IMPRESSION: 1. 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 2. Mild degenerative disc bulging at L1-2 and L3-4 without stenosis or neural impingement.   Electronically Signed   By: Jeannine Boga M.D.   On: 05/17/2017 04:35     Complexity Note: Imaging results reviewed. Results shared with Ms. Owens Shark, using Layman's terms.                         Meds   Current Outpatient Medications:  .  buPROPion (WELLBUTRIN XL)  150 MG 24 hr tablet, Take 150 mg by mouth daily., Disp: , Rfl:  .  cyclobenzaprine (FLEXERIL) 10 MG tablet, Take 10 mg by mouth as needed for muscle spasms., Disp: , Rfl:  .  hydrochlorothiazide (HYDRODIURIL) 25 MG tablet, Take 25 mg by mouth daily., Disp: , Rfl:  .  HYDROcodone-acetaminophen (NORCO/VICODIN) 5-325 MG tablet, Take 1 tablet by mouth as needed for moderate pain., Disp: , Rfl:  .  meloxicam (MOBIC) 7.5 MG tablet, Take 7.5 mg by mouth as needed for pain., Disp: , Rfl:  .  pantoprazole (PROTONIX) 40 MG tablet, Take 40 mg by mouth daily., Disp: , Rfl:   ROS  Constitutional: Denies any fever or chills Gastrointestinal: No reported hemesis, hematochezia, vomiting, or acute GI distress Musculoskeletal: Denies any acute onset joint swelling, redness, loss of ROM, or weakness Neurological: No reported episodes of acute onset apraxia, aphasia, dysarthria, agnosia, amnesia, paralysis, loss of coordination, or loss of consciousness  Allergies  Ms. Atha is allergic to betadine [povidone iodine].  [place]  Drug: Ms. Hett  has no drug history on file. Alcohol:  has no alcohol history on file. Tobacco:  reports that she has been smoking cigarettes.  She started smoking about 16 years ago. She has a 7.50 pack-year smoking history. she has never used smokeless tobacco. Medical:  has a past medical history of Migraines and Peptic ulcer. Surgical: Ms. Sutherlin  has a past surgical history that includes Foot surgery (1980); Cesarean section (1999 and 2001); laparoscopy; and Cervical fusion (2008). Family: family history includes Alcohol abuse in her paternal grandfather; Arthritis in her maternal grandmother, mother, and paternal grandmother; Birth defects in her brother; COPD in her father; Cancer in her paternal grandmother; Diabetes in her maternal grandfather; Hypertension in her mother; Kidney disease in her maternal grandmother; Learning disabilities in her brother; Mental retardation in her  brother; Miscarriages / Stillbirths in her mother; Stroke in her maternal grandfather and maternal grandmother; Varicose Veins in her mother.  Constitutional Exam  General appearance: Well nourished, well developed, and well hydrated. In no apparent acute distress Vitals:   01/03/18 1130  BP: (!) 148/91  Pulse: 91  Resp: 18  Temp: 98.7 F (37.1 C)  TempSrc: Oral  SpO2: 96%  Weight: 210 lb (95.3 kg)  Height: _0  (1.727 m)   BMI Assessment: Estimated  body mass index is 31.93 kg/m as calculated from the following:   Height as of this encounter: _0  (1.727 m).   Weight as of this encounter: 210 lb (95.3 kg).  BMI interpretation table: BMI level Category Range association with higher incidence of chronic pain  <18 kg/m2 Underweight   18.5-24.9 kg/m2 Ideal body weight   25-29.9 kg/m2 Overweight Increased incidence by 20%  30-34.9 kg/m2 Obese (Class I) Increased incidence by 68%  35-39.9 kg/m2 Severe obesity (Class II) Increased incidence by 136%  >40 kg/m2 Extreme obesity (Class III) Increased incidence by 254%   BMI Readings from Last 4 Encounters:  01/03/18 31.93 kg/m  08/01/17 32.11 kg/m  06/20/17 31.32 kg/m  06/07/17 31.32 kg/m   Wt Readings from Last 4 Encounters:  01/03/18 210 lb (95.3 kg)  08/01/17 205 lb (93 kg)  06/20/17 206 lb (93.4 kg)  06/07/17 206 lb (93.4 kg)  Psych/Mental status: Alert, oriented x 3 (person, place, & time)       Eyes: PERLA Respiratory: No evidence of acute respiratory distress  Cervical Spine Area Exam  Skin & Axial Inspection: No masses, redness, edema, swelling, or associated skin lesions Alignment: Symmetrical Functional ROM: Unrestricted ROM      Stability: No instability detected Muscle Tone/Strength: Functionally intact. No obvious neuro-muscular anomalies detected. Sensory (Neurological): Unimpaired Palpation: No palpable anomalies              Upper Extremity (UE) Exam    Side: Right upper extremity  Side: Left upper  extremity  Skin & Extremity Inspection: Skin color, temperature, and hair growth are WNL. No peripheral edema or cyanosis. No masses, redness, swelling, asymmetry, or associated skin lesions. No contractures.  Skin & Extremity Inspection: Skin color, temperature, and hair growth are WNL. No peripheral edema or cyanosis. No masses, redness, swelling, asymmetry, or associated skin lesions. No contractures.  Functional ROM: Unrestricted ROM          Functional ROM: Unrestricted ROM          Muscle Tone/Strength: Functionally intact. No obvious neuro-muscular anomalies detected.  Muscle Tone/Strength: Functionally intact. No obvious neuro-muscular anomalies detected.  Sensory (Neurological): Unimpaired          Sensory (Neurological): Unimpaired          Palpation: No palpable anomalies              Palpation: No palpable anomalies              Specialized Test(s): Deferred         Specialized Test(s): Deferred          Thoracic Spine Area Exam  Skin & Axial Inspection: No masses, redness, or swelling Alignment: Symmetrical Functional ROM: Unrestricted ROM Stability: No instability detected Muscle Tone/Strength: Functionally intact. No obvious neuro-muscular anomalies detected. Sensory (Neurological): Unimpaired Muscle strength & Tone: No palpable anomalies  Lumbar Spine Area Exam  Skin & Axial Inspection: No masses, redness, or swelling Alignment: Symmetrical Functional ROM: Diminished ROM      Stability: No instability detected Muscle Tone/Strength: Functionally intact. No obvious neuro-muscular anomalies detected. Sensory (Neurological): Dermatomal pain pattern Palpation: No palpable anomalies       Provocative Tests: Lumbar Hyperextension and rotation test: evaluation deferred today       Lumbar Lateral bending test: Positive ipsilateral radicular pain, on the right. Positive for right-sided foraminal stenosis. Patrick's Maneuver: evaluation deferred today  Positive  straight leg raise test on the right Gait & Posture Assessment  Ambulation: Unassisted Gait: Relatively normal for age and body habitus Posture: WNL   Lower Extremity Exam    Side: Right lower extremity  Side: Left lower extremity  Skin & Extremity Inspection: Skin color, temperature, and hair growth are WNL. No peripheral edema or cyanosis. No masses, redness, swelling, asymmetry, or associated skin lesions. No contractures.  Skin & Extremity Inspection: Skin color, temperature, and hair growth are WNL. No peripheral edema or cyanosis. No masses, redness, swelling, asymmetry, or associated skin lesions. No contractures.  Functional ROM: Unrestricted ROM          Functional ROM: Unrestricted ROM          Muscle Tone/Strength: Functionally intact. No obvious neuro-muscular anomalies detected.  Muscle Tone/Strength: Functionally intact. No obvious neuro-muscular anomalies detected.  Sensory (Neurological): Unimpaired  Sensory (Neurological): Unimpaired  Palpation: No palpable anomalies  Palpation: No palpable anomalies   Assessment  Primary Diagnosis & Pertinent Problem List: The primary encounter diagnosis was Lumbar radiculopathy. Diagnoses of Lumbar degenerative disc disease, Spinal stenosis of lumbar region without neurogenic claudication, and Obesity, unspecified classification, unspecified obesity type, unspecified whether serious comorbidity present were also pertinent to this visit.  Status Diagnosis  Having a Flare-up Persistent Persistent 1. Lumbar radiculopathy   2. Lumbar degenerative disc disease   3. Spinal stenosis of lumbar region without neurogenic claudication   4. Obesity, unspecified classification, unspecified obesity type, unspecified whether serious comorbidity present     Problems updated and reviewed during this visit: Problem  Lumbar Radiculopathy  Lumbar Degenerative Disc Disease  Spinal Stenosis of Lumbar Region Without Neurogenic Claudication   52 year old  female with a history of right>left lumbosacral radiculopathy status post L4-L5 epidural steroid injection performed on June 07, 2017 who had significant improvement of her radicular symptoms after the epidural.  Patient went on to participate in physical therapy and has been doing exercises at home.  She states that over the last 2-3 weeks she has noticed return of her right lower extremity pain.  We discussed repeating right L4, L5 epidural steroid injection #2.  Risks and benefits were discussed.  Patient would like to proceed.   Plan of Care  Lab-work, procedure(s), and/or referral(s): Orders Placed This Encounter  Procedures  . Lumbar Epidural Injection    Provider-requested follow-up: Return in about 2 weeks (around 01/17/2018) for Procedure. Time Note: Greater than 50% of the 25 minute(s) of face-to-face time spent with Ms. Pyle, was spent in counseling/coordination of care regarding: Ms. Nordlund primary cause of pain, the treatment plan, treatment alternatives, the risks and possible complications of proposed treatment, going over the informed consent and the goals of pain management (increased in functionality). Future Appointments  Date Time Provider [place]  01/22/2018  9:00 AM Gillis Santa, MD [facility] None    Primary Care Physician: Rusty Aus, MD Location: [facility] Outpatient Pain Management Facility Note by: Gillis Santa, M.D Date: 01/03/2018; Time: 2:19 PM  There are no Patient Instructions on file for this visit.

## 2018-01-03 NOTE — Progress Notes (Signed)
Safety precautions to be maintained throughout the outpatient stay will include: orient to surroundings, keep bed in low position, maintain call bell within reach at all times, provide assistance with transfer out of bed and ambulation.  

## 2018-01-22 ENCOUNTER — Encounter: Payer: Self-pay | Admitting: Student in an Organized Health Care Education/Training Program

## 2018-01-22 ENCOUNTER — Ambulatory Visit (HOSPITAL_BASED_OUTPATIENT_CLINIC_OR_DEPARTMENT_OTHER): Payer: BC Managed Care – PPO | Admitting: Student in an Organized Health Care Education/Training Program

## 2018-01-22 ENCOUNTER — Ambulatory Visit
Admission: RE | Admit: 2018-01-22 | Discharge: 2018-01-22 | Disposition: A | Discharge status: Home / Self Care | Payer: BC Managed Care – PPO | Source: Ambulatory Visit | Attending: Student in an Organized Health Care Education/Training Program | Admitting: Student in an Organized Health Care Education/Training Program

## 2018-01-22 VITALS — BP 119/85 | HR 84 | Temp 98.0°F | Resp 17 | Ht 68.0 in | Wt 215.0 lb

## 2018-01-22 DIAGNOSIS — M5416 Radiculopathy, lumbar region: Secondary | ICD-10-CM

## 2018-01-22 MED ORDER — MIDAZOLAM HCL 5 MG/5ML IJ SOLN
1.0000 mg | INTRAMUSCULAR | Status: DC | PRN
  Administered 2018-01-22: 1 mg via INTRAVENOUS
  Filled 2018-01-22: qty 5

## 2018-01-22 MED ORDER — IOPAMIDOL (ISOVUE-M 200) INJECTION 41%
10.0000 mL | Freq: Once | INTRAMUSCULAR | Status: AC
  Administered 2018-01-22: 10 mL via EPIDURAL
  Filled 2018-01-22: qty 10

## 2018-01-22 MED ORDER — DEXAMETHASONE SODIUM PHOSPHATE 10 MG/ML IJ SOLN
10.0000 mg | Freq: Once | INTRAMUSCULAR | Status: AC
  Administered 2018-01-22: 10 mg
  Filled 2018-01-22: qty 1

## 2018-01-22 MED ORDER — LACTATED RINGERS IV SOLN
1000.0000 mL | Freq: Once | INTRAVENOUS | Status: AC
  Administered 2018-01-22: 1000 mL via INTRAVENOUS

## 2018-01-22 MED ORDER — ROPIVACAINE HCL 2 MG/ML IJ SOLN
2.0000 mL | Freq: Once | INTRAMUSCULAR | Status: AC
  Administered 2018-01-22: 10 mL via EPIDURAL
  Filled 2018-01-22: qty 10

## 2018-01-22 MED ORDER — SODIUM CHLORIDE 0.9% FLUSH
2.0000 mL | Freq: Once | INTRAVENOUS | Status: AC
  Administered 2018-01-22: 10 mL

## 2018-01-22 MED ORDER — LIDOCAINE HCL (PF) 1 % IJ SOLN
4.5000 mL | Freq: Once | INTRAMUSCULAR | Status: AC
  Administered 2018-01-22: 5 mL
  Filled 2018-01-22: qty 5

## 2018-01-22 NOTE — Patient Instructions (Signed)

## 2018-01-22 NOTE — Progress Notes (Signed)
Patient's Name: Rebecca Barker  MRN: [id number]  Referring Provider: Rusty Aus, MD  DOB: 04/06/1966  PCP: Rusty Aus, MD  DOS: 01/22/2018  Note by: Gillis Santa, MD  Service setting: Ambulatory outpatient  Specialty: Interventional Pain Management  Patient type: Established  Location: [facility] (AMB) Pain Management Facility  Visit type: Interventional Procedure   Primary Reason for Visit: Interventional Pain Management Treatment. CC: Back Pain (lower more on the right)  Procedure:       Anesthesia, Analgesia, Anxiolysis:  Type: Therapeutic Inter-Laminar Epidural Steroid Injection #2  Region: Lumbar Level: L4-5 Level. Laterality: Right-Sided         Type: Moderate (Conscious) Sedation combined with Local Anesthesia Indication(s): Analgesia and Anxiety Route: Intravenous (IV) IV Access: Secured Sedation: Meaningful verbal contact was maintained at all times during the procedure  Local Anesthetic: Lidocaine 1-2%   Indications: 1. Lumbar radiculopathy    Pain Score: Pre-procedure: 4 /10 Post-procedure: 0-No pain/10  Pre-op Assessment:  Rebecca Barker is a 52 y.o. (year old), female patient, seen today for interventional treatment. She  has a past surgical history that includes Foot surgery (1980); Cesarean section (1999 and 2001); laparoscopy; and Cervical fusion (2008). Rebecca Barker has a current medication list which includes the following prescription(s): bupropion, cyclobenzaprine, hydrochlorothiazide, hydrocodone-acetaminophen, pantoprazole, phentermine, and meloxicam, and the following Facility-Administered Medications: midazolam. Her primarily concern today is the Back Pain (lower more on the right)  Initial Vital Signs:  Pulse Rate: 84 Temp: 98 F (36.7 C) Resp: (!) 116 BP: 139/84 SpO2: 98 %  BMI: Estimated body mass index is 32.69 kg/m as calculated from the following:   Height as of this encounter: 5\' 8"  (1.727 m).   Weight as of this encounter: 215 lb (97.5  kg).  Risk Assessment: Allergies: Reviewed. She is allergic to betadine [povidone iodine].  Allergy Precautions: None required Coagulopathies: Reviewed. None identified.  Blood-thinner therapy: None at this time Active Infection(s): Reviewed. None identified. Rebecca Barker is afebrile  Site Confirmation: Rebecca Barker was asked to confirm the procedure and laterality before marking the site Procedure checklist: Completed Consent: Before the procedure and under the influence of no sedative(s), amnesic(s), or anxiolytics, the patient was informed of the treatment options, risks and possible complications. To fulfill our ethical and legal obligations, as recommended by the American Medical Association's Code of Ethics, I have informed the patient of my clinical impression; the nature and purpose of the treatment or procedure; the risks, benefits, and possible complications of the intervention; the alternatives, including doing nothing; the risk(s) and benefit(s) of the alternative treatment(s) or procedure(s); and the risk(s) and benefit(s) of doing nothing. The patient was provided information about the general risks and possible complications associated with the procedure. These may include, but are not limited to: failure to achieve desired goals, infection, bleeding, organ or nerve damage, allergic reactions, paralysis, and death. In addition, the patient was informed of those risks and complications associated to Spine-related procedures, such as failure to decrease pain; infection (i.e.: Meningitis, epidural or intraspinal abscess); bleeding (i.e.: epidural hematoma, subarachnoid hemorrhage, or any other type of intraspinal or peri-dural bleeding); organ or nerve damage (i.e.: Any type of peripheral nerve, nerve root, or spinal cord injury) with subsequent damage to sensory, motor, and/or autonomic systems, resulting in permanent pain, numbness, and/or weakness of one or several areas of the body; allergic  reactions; (i.e.: anaphylactic reaction); and/or death. Furthermore, the patient was informed of those risks and complications associated with the medications. These include,  but are not limited to: allergic reactions (i.e.: anaphylactic or anaphylactoid reaction(s)); adrenal axis suppression; blood sugar elevation that in diabetics may result in ketoacidosis or comma; water retention that in patients with history of congestive heart failure may result in shortness of breath, pulmonary edema, and decompensation with resultant heart failure; weight gain; swelling or edema; medication-induced neural toxicity; particulate matter embolism and blood vessel occlusion with resultant organ, and/or nervous system infarction; and/or aseptic necrosis of one or more joints. Finally, the patient was informed that Medicine is not an exact science; therefore, there is also the possibility of unforeseen or unpredictable risks and/or possible complications that may result in a catastrophic outcome. The patient indicated having understood very clearly. We have given the patient no guarantees and we have made no promises. Enough time was given to the patient to ask questions, all of which were answered to the patient's satisfaction. Rebecca Barker has indicated that she wanted to continue with the procedure. Attestation: I, the ordering provider, attest that I have discussed with the patient the benefits, risks, side-effects, alternatives, likelihood of achieving goals, and potential problems during recovery for the procedure that I have provided informed consent. Date  Time: 01/22/2018  8:45 AM  Pre-Procedure Preparation:  Monitoring: As per clinic protocol. Respiration, ETCO2, SpO2, BP, heart rate and rhythm monitor placed and checked for adequate function Safety Precautions: Patient was assessed for positional comfort and pressure points before starting the procedure. Time-out: I initiated and conducted the "Time-out" before  starting the procedure, as per protocol. The patient was asked to participate by confirming the accuracy of the "Time Out" information. Verification of the correct person, site, and procedure were performed and confirmed by me, the nursing staff, and the patient. "Time-out" conducted as per Joint Commission's Universal Protocol (UP.01.01.01). Time: 0921  Description of Procedure:       Position: Prone with head of the table was raised to facilitate breathing. Target Area: The interlaminar space, initially targeting the lower laminar border of the superior vertebral body. Approach: Paramedial approach. Area Prepped: Entire Posterior Lumbar Region Prepping solution: ChloraPrep (2% chlorhexidine gluconate and 70% isopropyl alcohol) Safety Precautions: Aspiration looking for blood return was conducted prior to all injections. At no point did we inject any substances, as a needle was being advanced. No attempts were made at seeking any paresthesias. Safe injection practices and needle disposal techniques used. Medications properly checked for expiration dates. SDV (single dose vial) medications used. Description of the Procedure: Protocol guidelines were followed. The procedure needle was introduced through the skin, ipsilateral to the reported pain, and advanced to the target area. Bone was contacted and the needle walked caudad, until the lamina was cleared. The epidural space was identified using "loss-of-resistance technique" with 2-3 ml of PF-NaCl (0.9% NSS), in a 5cc LOR glass syringe. Vitals:   01/22/18 0931 01/22/18 0944 01/22/18 0953 01/22/18 1003  BP: 110/89 130/71 136/88 119/85  Pulse:      Resp: 16 (!) 21 (!) 21 17  Temp:      TempSrc:      SpO2: 98% 99% 96% 96%  Weight:      Height:        Start Time: 0922 hrs. End Time: 0931 hrs. Materials:  Needle(s) Type: Epidural needle Gauge: 17G Length: 3.5-in Medication(s): Please see orders for medications and dosing details. 8 CC  solution consisting of 5 cc of preservative-free saline, 2 cc of 0.2% ropivacaine, 1 cc of Decadron 10 mg/cc. Imaging Guidance (Spinal):  Type  of Imaging Technique: Fluoroscopy Guidance (Spinal) Indication(s): Assistance in needle guidance and placement for procedures requiring needle placement in or near specific anatomical locations not easily accessible without such assistance. Exposure Time: Please see nurses notes. Contrast: Before injecting any contrast, we confirmed that the patient did not have an allergy to iodine, shellfish, or radiological contrast. Once satisfactory needle placement was completed at the desired level, radiological contrast was injected. Contrast injected under live fluoroscopy. No contrast complications. See chart for type and volume of contrast used. Fluoroscopic Guidance: I was personally present during the use of fluoroscopy. "Tunnel Vision Technique" used to obtain the best possible view of the target area. Parallax error corrected before commencing the procedure. "Direction-depth-direction" technique used to introduce the needle under continuous pulsed fluoroscopy. Once target was reached, antero-posterior, oblique, and lateral fluoroscopic projection used confirm needle placement in all planes. Images permanently stored in EMR. Interpretation: I personally interpreted the imaging intraoperatively. Adequate needle placement confirmed in multiple planes. Appropriate spread of contrast into desired area was observed. No evidence of afferent or efferent intravascular uptake. No intrathecal or subarachnoid spread observed. Permanent images saved into the patient's record.  Antibiotic Prophylaxis:   Anti-infectives (From admission, onward)   None     Indication(s): None identified  Post-operative Assessment:  Post-procedure Vital Signs:  Pulse Rate: 84 Temp: 98 F (36.7 C) Resp: 17 BP: 119/85 SpO2: 96 %  EBL: None  Complications: No immediate post-treatment  complications observed by team, or reported by patient.  Note: The patient tolerated the entire procedure well. A repeat set of vitals were taken after the procedure and the patient was kept under observation following institutional policy, for this type of procedure. Post-procedural neurological assessment was performed, showing return to baseline, prior to discharge. The patient was provided with post-procedure discharge instructions, including a section on how to identify potential problems. Should any problems arise concerning this procedure, the patient was given instructions to immediately contact us, at any time, without hesitation. In any case, we plan to contact the patient by telephone for a follow-up status report regarding this interventional procedure.  Comments:  No additional relevant information. 5 out of 5 strength bilateral lower extremity: Plantar flexion, dorsiflexion, knee flexion, knee extension.  Plan of Care    Imaging Orders     DG C-Arm 1-60 Min-No Report Procedure Orders    No procedure(s) ordered today    Medications ordered for procedure: Meds ordered this encounter  Medications  . lactated ringers infusion 1,000 mL  . midazolam (VERSED) 5 MG/5ML injection 1-2 mg    Make sure Flumazenil is available in the pyxis when using this medication. If oversedation occurs, administer 0.2 mg IV over 15 sec. If after 45 sec no response, administer 0.2 mg again over 1 min; may repeat at 1 min intervals; not to exceed 4 doses (1 mg)  . iopamidol (ISOVUE-M) 41 % intrathecal injection 10 mL  . ropivacaine (PF) 2 mg/mL (0.2%) (NAROPIN) injection 2 mL  . sodium chloride flush (NS) 0.9 % injection 2 mL  . dexamethasone (DECADRON) injection 10 mg  . lidocaine (PF) (XYLOCAINE) 1 % injection 4.5 mL   Medications administered: We administered lactated ringers, midazolam, iopamidol, ropivacaine (PF) 2 mg/mL (0.2%), sodium chloride flush, dexamethasone, and lidocaine (PF).  See the  medical record for exact dosing, route, and time of administration.  New Prescriptions   No medications on file   Disposition: Discharge home  Discharge Date & Time: 01/22/2018; 1004 hrs.   Physician-requested Follow-up:  Return in about 3 weeks (around 02/12/2018) for Post Procedure Evaluation.  Future Appointments  Date Time Provider [place]  02/12/2018  2:00 PM Gillis Santa, MD [facility] [place] None   Primary Care Physician: Rusty Aus, MD Location: [facility] Note by: Gillis Santa, MD Date: 01/22/2018; Time: 12:01 PM  Disclaimer:  Medicine is not an exact science. The only guarantee in medicine is that nothing is guaranteed. It is important to note that the decision to proceed with this intervention was based on the information collected from the patient. The Data and conclusions were drawn from the patient's questionnaire, the interview, and the physical examination. Because the information was provided in large part by the patient, it cannot be guaranteed that it has not been purposely or unconsciously manipulated. Every effort has been made to obtain as much relevant data as possible for this evaluation. It is important to note that the conclusions that lead to this procedure are derived in large part from the available data. Always take into account that the treatment will also be dependent on availability of resources and existing treatment guidelines, considered by other Pain Management Practitioners as being common knowledge and practice, at the time of the intervention. For Medico-Legal purposes, it is also important to point out that variation in procedural techniques and pharmacological choices are the acceptable norm. The indications, contraindications, technique, and results of the above procedure should only be interpreted and judged by a Board-Certified Interventional Pain Specialist with extensive familiarity and expertise in the same exact  procedure and technique.

## 2018-01-22 NOTE — Progress Notes (Signed)
Safety precautions to be maintained throughout the outpatient stay will include: orient to surroundings, keep bed in low position, maintain call bell within reach at all times, provide assistance with transfer out of bed and ambulation.  

## 2018-01-23 ENCOUNTER — Telehealth: Payer: Self-pay | Admitting: *Deleted

## 2018-01-23 NOTE — Telephone Encounter (Signed)
No problems post procedure. 

## 2018-02-12 ENCOUNTER — Ambulatory Visit: Payer: BC Managed Care – PPO | Admitting: Student in an Organized Health Care Education/Training Program

## 2018-07-12 DIAGNOSIS — D369 Benign neoplasm, unspecified site: Secondary | ICD-10-CM | POA: Insufficient documentation

## 2018-09-10 ENCOUNTER — Other Ambulatory Visit: Payer: Self-pay | Admitting: Internal Medicine

## 2018-09-10 DIAGNOSIS — M5442 Lumbago with sciatica, left side: Principal | ICD-10-CM

## 2018-09-10 DIAGNOSIS — M5441 Lumbago with sciatica, right side: Secondary | ICD-10-CM

## 2018-09-17 ENCOUNTER — Other Ambulatory Visit: Payer: Self-pay | Admitting: Internal Medicine

## 2018-09-17 ENCOUNTER — Ambulatory Visit
Admission: RE | Admit: 2018-09-17 | Discharge: 2018-09-17 | Disposition: A | Discharge status: Home / Self Care | Payer: BC Managed Care – PPO | Source: Ambulatory Visit | Attending: Internal Medicine | Admitting: Internal Medicine

## 2018-09-17 DIAGNOSIS — M48061 Spinal stenosis, lumbar region without neurogenic claudication: Secondary | ICD-10-CM | POA: Insufficient documentation

## 2018-09-17 DIAGNOSIS — M5442 Lumbago with sciatica, left side: Principal | ICD-10-CM

## 2018-09-17 DIAGNOSIS — M5441 Lumbago with sciatica, right side: Secondary | ICD-10-CM | POA: Insufficient documentation

## 2018-09-17 DIAGNOSIS — M5136 Other intervertebral disc degeneration, lumbar region: Secondary | ICD-10-CM | POA: Diagnosis not present

## 2018-09-19 ENCOUNTER — Other Ambulatory Visit: Payer: BC Managed Care – PPO

## 2018-12-02 IMAGING — MR MR LUMBAR SPINE W/O CM
5 series · 31 of 48 positions shown · non-contrast
Comparison: Lumbar MRI 05/16/2017 and earlier.

CLINICAL DATA: 52-year-old female with low back pain radiating to
the right hip, leg and foot with a burning sensation for 1 week.

EXAM:
MRI LUMBAR SPINE WITHOUT CONTRAST
TECHNIQUE: Multiplanar, multisequence MR imaging of the lumbar spine was
performed. No intravenous contrast was administered.

[Series 5: T2 · sagittal · 4.0mm · 0.88mm/px · 6 of 15 slices shown (1 of 2)]
[im 1/15]
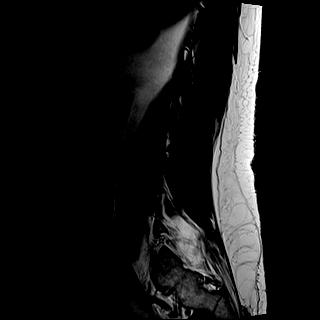
[im 3/15]
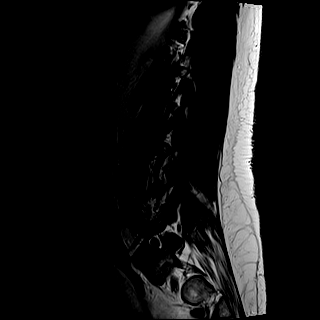
[im 6/15]
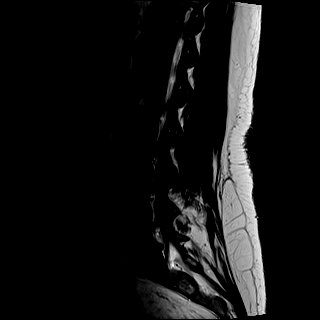
[im 9/15]
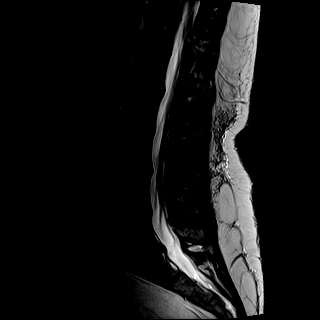
[im 12/15]
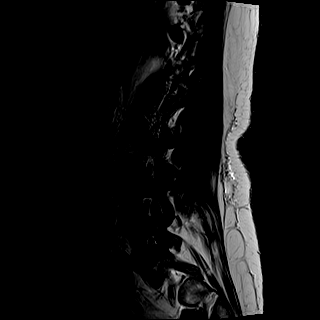
[im 15/15]
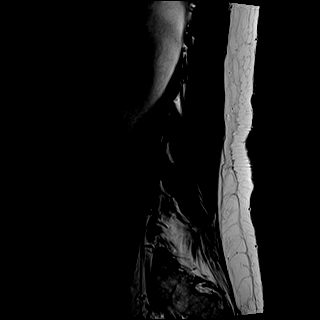

[Series 6: T1 · sagittal · 4.0mm · 0.88mm/px · 6 of 15 slices shown (1 of 2)]
[im 1/15]
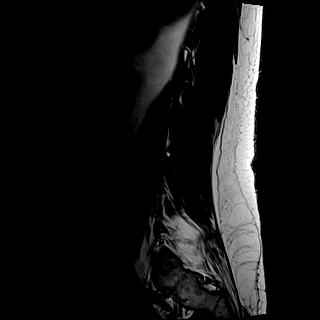
[im 3/15]
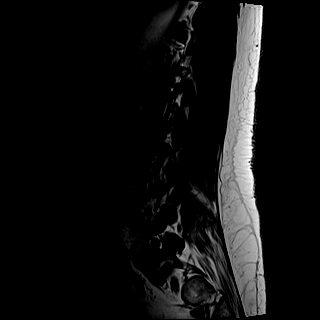
[im 6/15]
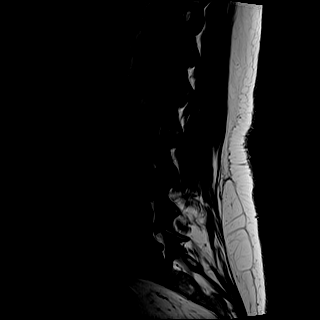
[im 9/15]
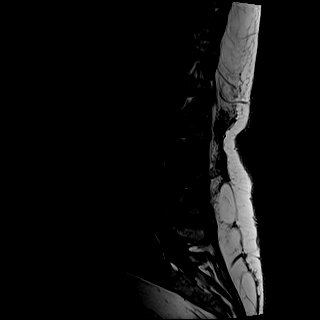
[im 12/15]
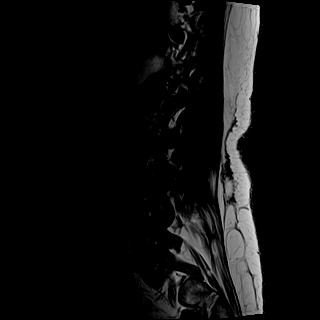
[im 15/15]
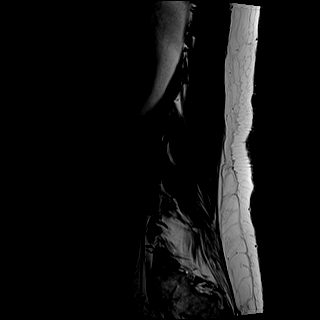

[Series 7: STIR · sagittal · 4.0mm · 0.44mm/px · 1 of 15 slices shown]
[im 1/15]
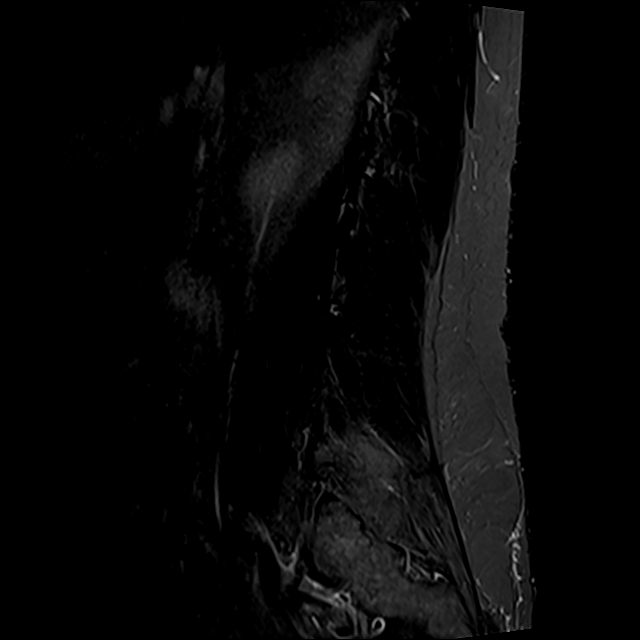

[Series 8: T2 · axial · 4.0mm · 0.78mm/px · z∈[-108,+116]mm · 9 of 38 slices shown (2 of 2)]
[im 1/38]
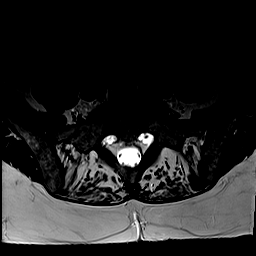
[im 6/38]
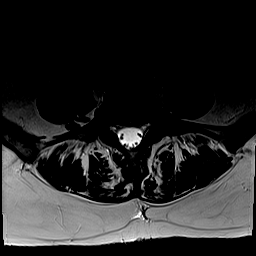
[im 11/38]
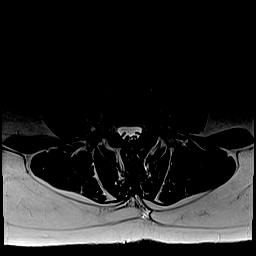
[im 16/38]
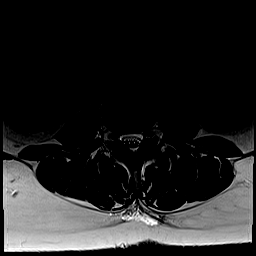
[im 19/38]
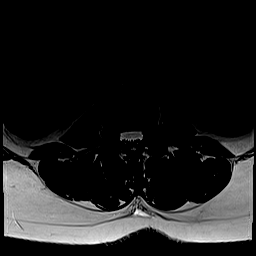
[im 22/38]
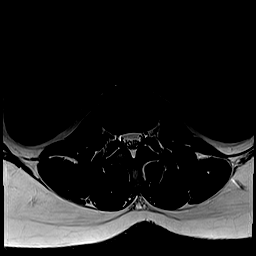
[im 27/38]
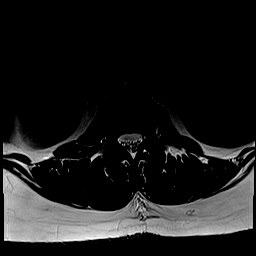
[im 32/38]
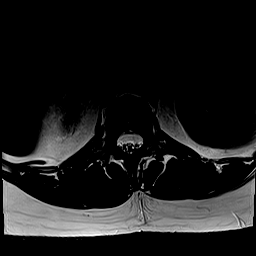
[im 38/38]
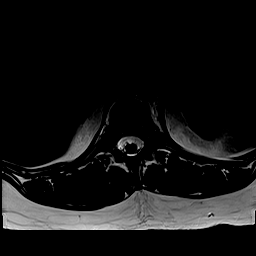

[Series 9: T1 · axial · 4.0mm · 0.39mm/px · z∈[-108,+116]mm · 9 of 38 slices shown (2 of 2)]
[im 1/38]
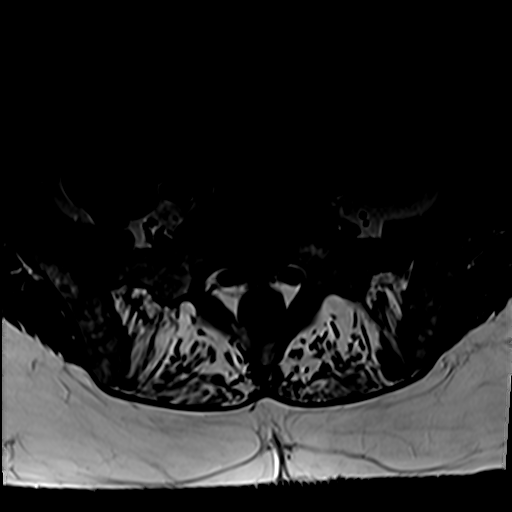
[im 6/38]
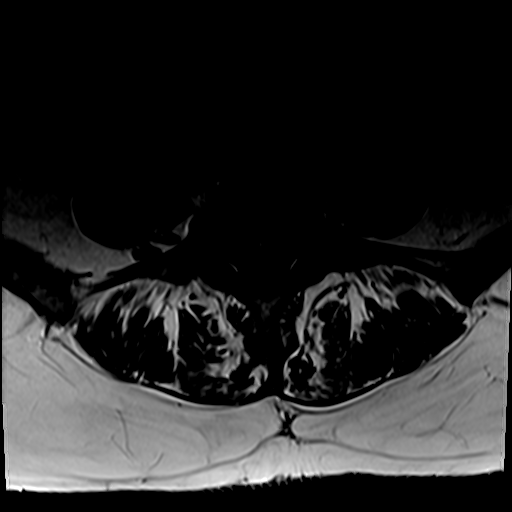
[im 11/38]
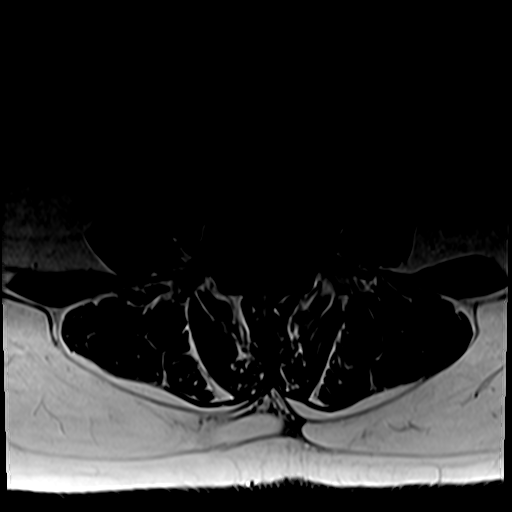
[im 16/38]
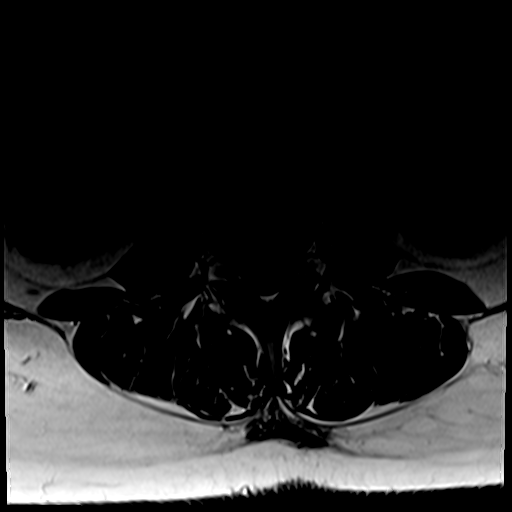
[im 19/38]
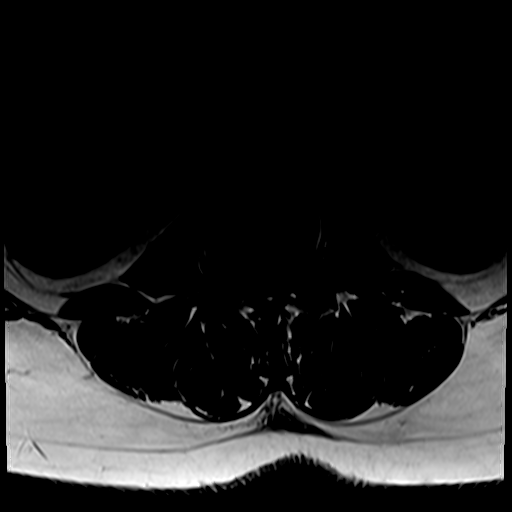
[im 22/38]
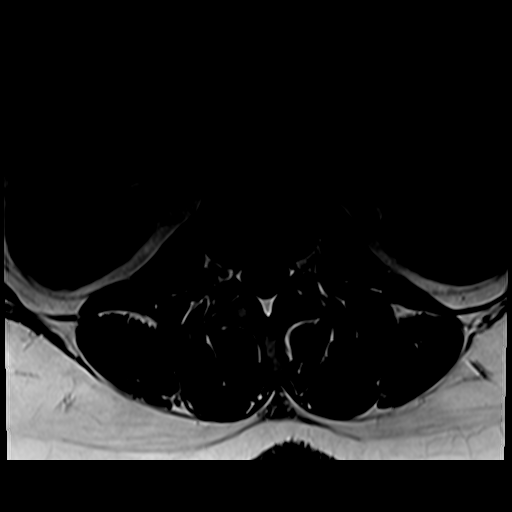
[im 27/38]
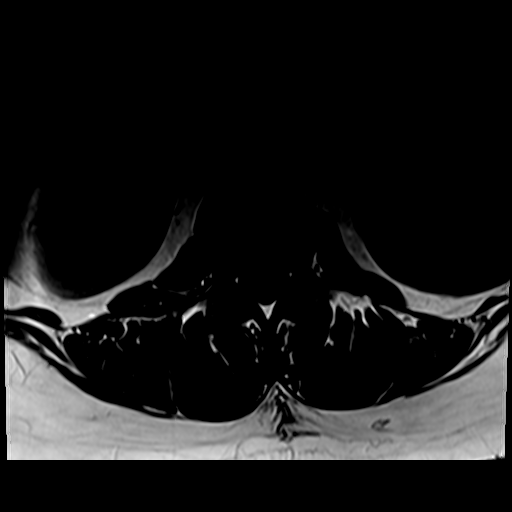
[im 32/38]
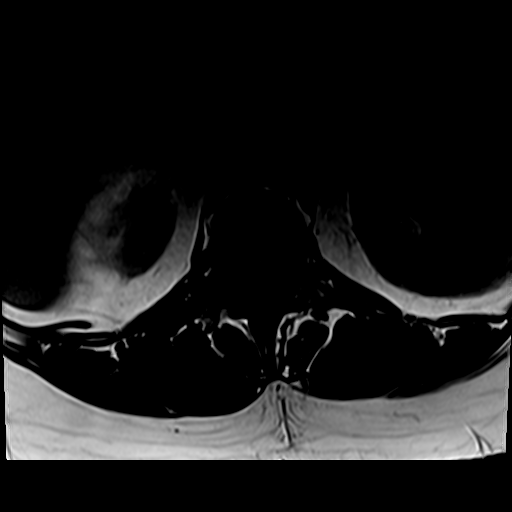
[im 38/38]
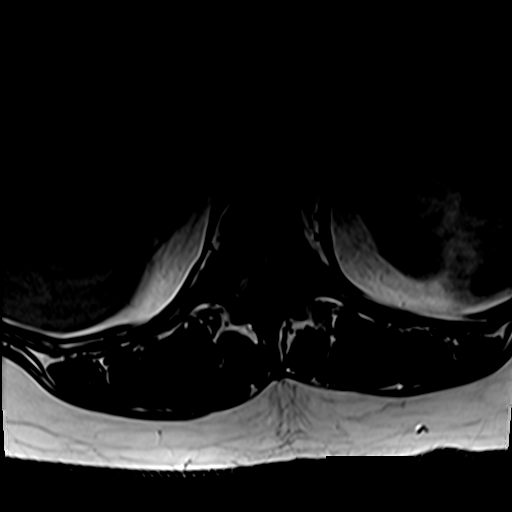

[31 of 48 positions shown; findings below may reference images not displayed]

FINDINGS: Segmentation: Normal on prior CT Abdomen and Pelvis 06/26/2011, the
same numbering system used on the 5814 MRI.

Alignment: Stable lumbar lordosis. Subtle retrolisthesis at L3-L4
and L5-S1 is stable.

Vertebrae: No marrow edema or evidence of acute osseous abnormality.
Visualized bone marrow signal is within normal limits. Intact
visible sacrum and SI joints.

Conus medullaris and cauda equina: Conus extends to the L1 level. No
lower spinal cord or conus signal abnormality.

Paraspinal and other soft tissues: Stable, negative.

Disc levels:

T11-T12: Mild facet hypertrophy.  No stenosis.

T12-L1:  Negative.

L1-L2: Mild chronic disc desiccation, circumferential disc bulge.
Subtle central chronic annular fissure. No stenosis.

L2-L3:  Negative.

L3-L4: Chronic mild retrolisthesis, disc desiccation,
circumferential disc bulge eccentric to the right.. Resolved right
facet joint fluid. No spinal or convincing lateral recess stenosis.
Mild right L3 foraminal stenosis is stable.

L4-L5: Mild to moderate facet and ligament flavum hypertrophy. Trace
degenerative facet joint fluid. No stenosis.

L5-S1: Chronic mild retrolisthesis with disc space loss and
circumferential disc osteophyte complex. Mild facet hypertrophy. No
spinal or lateral recess stenosis. Mild to moderate bilateral L5
foraminal stenosis is stable.
IMPRESSION: 1. Stable MRI appearance of the lumbar spine since [DATE]. Intermittent lumbar disc and posterior element degeneration with
no spinal or convincing lateral recess stenosis. Mild right L3 and
mild to moderate bilateral L5 neural foraminal stenosis appears
stable.

## 2020-01-05 ENCOUNTER — Ambulatory Visit: Payer: BC Managed Care – PPO | Attending: Internal Medicine

## 2020-01-05 DIAGNOSIS — Z23 Encounter for immunization: Secondary | ICD-10-CM | POA: Insufficient documentation

## 2020-01-05 NOTE — Progress Notes (Signed)
   Covid-19 Vaccination Clinic  Name:  Zeida Ratkovich    MRN: [id number] DOB: [birth year]  01/05/2020  Ms. Steuer was observed post Covid-19 immunization for 15 minutes without incidence. She was provided with Vaccine Information Sheet and instruction to access the V-Safe system.   Ms. Devantier was instructed to call 911 with any severe reactions post vaccine: Marland Kitchen Difficulty breathing  . Swelling of your face and throat  . A fast heartbeat  . A bad rash all over your body  . Dizziness and weakness    Immunizations Administered    Name Date Dose VIS Date Route   Pfizer COVID-19 Vaccine 01/05/2020  2:43 PM 0.3 mL 10/18/2019 Intramuscular   Manufacturer: [place]   Lot: [id number]   [place]: [id number]

## 2020-01-28 ENCOUNTER — Ambulatory Visit: Payer: BC Managed Care – PPO | Attending: Internal Medicine

## 2020-01-28 DIAGNOSIS — Z23 Encounter for immunization: Secondary | ICD-10-CM

## 2020-01-28 NOTE — Progress Notes (Signed)
   Covid-19 Vaccination Clinic  Name:  Rebecca Barker    MRN: [id number] DOB: [birth year]  01/28/2020  [name] was observed post Covid-19 immunization for 15 minutes without incident. She was provided with Vaccine Information Sheet and instruction to access the V-Safe system.   [name] was instructed to call 911 with any severe reactions post vaccine: Marland Kitchen Difficulty breathing  . Swelling of face and throat  . A fast heartbeat  . A bad rash all over body  . Dizziness and weakness   Immunizations Administered    Name Date Dose VIS Date Route   Pfizer COVID-19 Vaccine 01/28/2020 10:15 AM 0.3 mL 10/18/2019 Intramuscular   Manufacturer: [employer]   Lot: [id number]   [place]: [id number]

## 2020-09-23 ENCOUNTER — Other Ambulatory Visit: Payer: BC Managed Care – PPO

## 2020-09-23 ENCOUNTER — Other Ambulatory Visit: Payer: Self-pay

## 2020-09-23 DIAGNOSIS — Z20822 Contact with and (suspected) exposure to covid-19: Secondary | ICD-10-CM

## 2020-09-24 LAB — NOVEL CORONAVIRUS, NAA: SARS-CoV-2, NAA: NOT DETECTED

## 2020-09-24 LAB — SARS-COV-2, NAA 2 DAY TAT

## 2021-12-24 DIAGNOSIS — I10 Essential (primary) hypertension: Secondary | ICD-10-CM | POA: Insufficient documentation

## 2022-06-23 ENCOUNTER — Ambulatory Visit
Admission: RE | Admit: 2022-06-23 | Discharge: 2022-06-23 | Disposition: A | Discharge status: Home / Self Care | Payer: BC Managed Care – PPO | Source: Ambulatory Visit | Attending: Internal Medicine | Admitting: Internal Medicine

## 2022-06-23 ENCOUNTER — Other Ambulatory Visit: Payer: Self-pay | Admitting: Internal Medicine

## 2022-06-23 DIAGNOSIS — R1011 Right upper quadrant pain: Secondary | ICD-10-CM | POA: Insufficient documentation

## 2022-06-23 DIAGNOSIS — K810 Acute cholecystitis: Secondary | ICD-10-CM | POA: Diagnosis present

## 2022-06-24 ENCOUNTER — Other Ambulatory Visit: Payer: Self-pay

## 2022-06-24 DIAGNOSIS — D72829 Elevated white blood cell count, unspecified: Secondary | ICD-10-CM | POA: Diagnosis not present

## 2022-06-24 DIAGNOSIS — A09 Infectious gastroenteritis and colitis, unspecified: Secondary | ICD-10-CM | POA: Diagnosis not present

## 2022-06-24 DIAGNOSIS — R1084 Generalized abdominal pain: Secondary | ICD-10-CM | POA: Diagnosis present

## 2022-06-24 LAB — COMPREHENSIVE METABOLIC PANEL
ALT: 27 U/L (ref 0–44)
AST: 20 U/L (ref 15–41)
Albumin: 4.3 g/dL (ref 3.5–5.0)
Alkaline Phosphatase: 65 U/L (ref 38–126)
Anion gap: 9 (ref 5–15)
BUN: 13 mg/dL (ref 6–20)
CO2: 28 mmol/L (ref 22–32)
Calcium: 9.4 mg/dL (ref 8.9–10.3)
Chloride: 101 mmol/L (ref 98–111)
Creatinine, Ser: 0.84 mg/dL (ref 0.44–1.00)
GFR, Estimated: >60 mL/min (ref >60)
Glucose, Bld: 85 mg/dL (ref 70–99)
Potassium: 3.6 mmol/L (ref 3.5–5.1)
Sodium: 138 mmol/L (ref 135–145)
Total Bilirubin: 0.6 mg/dL (ref 0.3–1.2)
Total Protein: 7.4 g/dL (ref 6.5–8.1)

## 2022-06-24 LAB — CBC
HCT: 45 % (ref 36.0–46.0)
Hemoglobin: 15 g/dL (ref 12.0–15.0)
MCH: 28.5 pg (ref 26.0–34.0)
MCHC: 33.3 g/dL (ref 30.0–36.0)
MCV: 85.6 fL (ref 80.0–100.0)
Platelets: 508 10*3/uL — ABNORMAL HIGH (ref 150–400)
RBC: 5.26 MIL/uL — ABNORMAL HIGH (ref 3.87–5.11)
RDW: 12.1 % (ref 11.5–15.5)
WBC: 11.7 10*3/uL — ABNORMAL HIGH (ref 4.0–10.5)
nRBC: 0 % (ref 0.0–0.2)

## 2022-06-24 LAB — URINALYSIS, ROUTINE W REFLEX MICROSCOPIC
Bilirubin Urine: NEGATIVE
Glucose, UA: NEGATIVE mg/dL
Hgb urine dipstick: NEGATIVE
Ketones, ur: NEGATIVE mg/dL
Leukocytes,Ua: NEGATIVE
Nitrite: NEGATIVE
Protein, ur: NEGATIVE mg/dL
Specific Gravity, Urine: 1.011 (ref 1.005–1.030)
pH: 6 (ref 5.0–8.0)

## 2022-06-24 LAB — LIPASE, BLOOD: Lipase: 35 U/L (ref 11–51)

## 2022-06-24 NOTE — ED Triage Notes (Signed)
Patient reports abdominal pain since Monday evening. Reports she was evaluated Wednesday at [place] [facility] and has had a CT yesterday. Started Cipro today around 1530. States severe lower abdominal pain and 12-15 episodes of loose green stool since 0300 today.  AOX4. Resp even, unlabored on RA. Ambulatory with steady gait.

## 2022-06-25 ENCOUNTER — Emergency Department
Admission: EM | Admit: 2022-06-25 | Discharge: 2022-06-25 | Disposition: A | Discharge status: Home / Self Care | Payer: BC Managed Care – PPO | Attending: Emergency Medicine | Admitting: Emergency Medicine

## 2022-06-25 DIAGNOSIS — A09 Infectious gastroenteritis and colitis, unspecified: Secondary | ICD-10-CM

## 2022-06-25 MED ORDER — ONDANSETRON HCL 4 MG/2ML IJ SOLN
4.0000 mg | Freq: Once | INTRAMUSCULAR | Status: AC
  Administered 2022-06-25: 4 mg via INTRAVENOUS
  Filled 2022-06-25: qty 2

## 2022-06-25 MED ORDER — KETOROLAC TROMETHAMINE 30 MG/ML IJ SOLN
30.0000 mg | Freq: Once | INTRAMUSCULAR | Status: AC
  Administered 2022-06-25: 30 mg via INTRAVENOUS
  Filled 2022-06-25: qty 1

## 2022-06-25 MED ORDER — DICYCLOMINE HCL 10 MG PO CAPS
20.0000 mg | ORAL_CAPSULE | Freq: Once | ORAL | Status: AC
  Administered 2022-06-25: 20 mg via ORAL
  Filled 2022-06-25: qty 2

## 2022-06-25 MED ORDER — SODIUM CHLORIDE 0.9 % IV BOLUS (SEPSIS)
1000.0000 mL | Freq: Once | INTRAVENOUS | Status: AC
  Administered 2022-06-25: 1000 mL via INTRAVENOUS

## 2022-06-25 MED ORDER — DICYCLOMINE HCL 20 MG PO TABS: 20.0000 mg | ORAL_TABLET | Freq: Three times a day (TID) | ORAL | 0 refills | Status: AC | PRN

## 2022-06-25 MED ORDER — ONDANSETRON 4 MG PO TBDP: 4.0000 mg | ORAL_TABLET | Freq: Four times a day (QID) | ORAL | 0 refills | Status: DC | PRN

## 2022-06-25 NOTE — Discharge Instructions (Addendum)
You may use Imodium over-the-counter as needed for diarrhea.  You may continue your hydrocodone as prescribed by Dr. Sabra Heck.

## 2022-06-25 NOTE — ED Provider Notes (Addendum)
[facility] Provider Note    Event Date/Time   First MD Initiated Contact with Patient 06/25/22 0215     (approximate)   History   Abdominal Pain   HPI  Rebecca Barker is a 56 y.o. female with history of migraines, peptic ulcer who presented to the emergency department with diffuse intermittent crampy abdominal pain that started on Monday the 14th.  She states that she had a CT of the abdomen pelvis on Thursday that was negative.  This was ordered by her primary care doctor, Dr. Sabra Heck.  She states when pain worsened yesterday she contacted Dr. Sabra Heck who started her on Cipro and gave her prescription for hydrocodone.  She took 1 Cipro tablet yesterday.  She has had intermittent diarrhea that is worsened over the past 24 hours and she reports she has had 12-15 episodes in a day and that they are green in appearance.  No blood in her stool.  She states she took a hydrocodone at home with some relief.  Patient has nausea without vomiting.  No fever.  Has had previous C-section x2, total hysterectomy.  States pain seems to be worse after eating and worse in the middle of the night.  Patient denies any sick contacts.  She did have COVID at the beginning of July.  She did go to the [place] in June but no other travel.      History provided by patient and husband.    Past Medical History:  Diagnosis Date   Migraines    Peptic ulcer     Past Surgical History:  Procedure Laterality Date   CERVICAL FUSION  2008   CESAREAN SECTION  1999 and 2001   FOOT SURGERY  1980   LAPAROSCOPY      MEDICATIONS:  Prior to Admission medications   Medication Sig Start Date End Date Taking? Authorizing Provider  buPROPion (WELLBUTRIN XL) 150 MG 24 hr tablet Take 150 mg by mouth daily.    [provider]  cyclobenzaprine (FLEXERIL) 10 MG tablet Take 10 mg by mouth as needed for muscle spasms.    [provider]  hydrochlorothiazide (HYDRODIURIL) 25 MG  tablet Take 25 mg by mouth daily.    [provider]  HYDROcodone-acetaminophen (NORCO/VICODIN) 5-325 MG tablet Take 1 tablet by mouth as needed for moderate pain.    [provider]  meloxicam (MOBIC) 7.5 MG tablet Take 7.5 mg by mouth as needed for pain.    [provider]  pantoprazole (PROTONIX) 40 MG tablet Take 40 mg by mouth daily.    [provider]  phentermine 30 MG capsule Take 1 capsule by mouth daily. 01/08/18 02/07/18  [provider]    Physical Exam   Triage Vital Signs: ED Triage Vitals  Enc Vitals Group     BP 06/24/22 1918 (!) 155/101     Pulse Rate 06/24/22 1918 69     Resp 06/24/22 1918 20     Temp 06/24/22 1918 98.3 F (36.8 C)     Temp Source 06/24/22 1918 Oral     SpO2 06/24/22 1918 97 %     Weight 06/24/22 1919 192 lb (87.1 kg)     Height 06/24/22 1919 '5\' 8"'$  (1.727 m)     Head Circumference --      Peak Flow --      Pain Score 06/24/22 1926 10     Pain Loc --      Pain Edu? --  Excl. in [place]? --     Most recent vital signs: Vitals:   06/25/22 0450 06/25/22 0600  BP: (!) 135/90 118/71  Pulse: 71 67  Resp: 17 17  Temp: 98 F (36.7 C)   SpO2: 98% 96%    CONSTITUTIONAL: Alert and oriented and responds appropriately to questions. Well-appearing; well-nourished HEAD: Normocephalic, atraumatic EYES: Conjunctivae clear, pupils appear equal, sclera nonicteric ENT: normal nose; moist mucous membranes NECK: Supple, normal ROM CARD: RRR; S1 and S2 appreciated; no murmurs, no clicks, no rubs, no gallops RESP: Normal chest excursion without splinting or tachypnea; breath sounds clear and equal bilaterally; no wheezes, no rhonchi, no rales, no hypoxia or respiratory distress, speaking full sentences ABD/GI: Normal bowel sounds; non-distended; soft, non-tender, no rebound, no guarding, no peritoneal signs BACK: The back appears normal EXT: Normal ROM in all joints; no deformity noted, no edema; no cyanosis SKIN:  Normal color for age and race; warm; no rash on exposed skin NEURO: Moves all extremities equally, normal speech PSYCH: The patient's mood and manner are appropriate.   ED Results / Procedures / Treatments   LABS: (all labs ordered are listed, but only abnormal results are displayed) Labs Reviewed  CBC - Abnormal; Notable for the following components:      Result Value   WBC 11.7 (*)    RBC 5.26 (*)    Platelets 508 (*)    All other components within normal limits  URINALYSIS, ROUTINE W REFLEX MICROSCOPIC - Abnormal; Notable for the following components:   Color, Urine YELLOW (*)    APPearance CLEAR (*)    All other components within normal limits  GASTROINTESTINAL PANEL BY PCR, STOOL (REPLACES STOOL CULTURE)  C DIFFICILE QUICK SCREEN W PCR REFLEX    LIPASE, BLOOD  COMPREHENSIVE METABOLIC PANEL     EKG:   RADIOLOGY: My personal review and interpretation of imaging:   I have personally reviewed all radiology reports.   No results found.   PROCEDURES:  Critical Care performed: No     Procedures    IMPRESSION / MDM / ASSESSMENT AND PLAN / ED COURSE  I reviewed the triage vital signs and the nursing notes.   Patient here with abdominal pain, cramping, diarrhea.    DIFFERENTIAL DIAGNOSIS (includes but not limited to):   Infectious diarrhea, colitis, diverticulitis, less likely appendicitis, perforation   Patient's presentation is most consistent with acute presentation with potential threat to life or bodily function.   PLAN: CBC, CMP, lipase, urine obtained from triage.  CBC shows slight leukocytosis of 11.7 but normal creatinine, electrolytes, LFTs, lipase.  Urine shows no ketonuria to suggest dehydration.  We discussed risk and benefits of repeat CT imaging but given benign abdominal exam today and CT that was done several days ago with similar symptoms, we have decided to hold off at this time.  CT did show diverticulosis without diverticulitis.  She is  not tender at McBurney's point or in the right upper quadrant.  We will give IV fluids, Toradol, Bentyl, Zofran and reassess.   MEDICATIONS GIVEN IN ED: Medications  sodium chloride 0.9 % bolus 1,000 mL (0 mLs Intravenous Stopped 06/25/22 0449)  ondansetron (ZOFRAN) injection 4 mg (4 mg Intravenous Given 06/25/22 0258)  ketorolac (TORADOL) 30 MG/ML injection 30 mg (30 mg Intravenous Given 06/25/22 0259)  dicyclomine (BENTYL) capsule 20 mg (20 mg Oral Given 06/25/22 0304)     ED COURSE: Patient states she is feeling much better.  Will p.o. challenge.  Stool cultures have  been ordered but she has not had any further diarrhea while in the treatment room.    Patient has been able to eat and drink without difficulty and has no return of her pain.  We have monitored her for several hours in the treatment room and she has not been able to provide [place] with a stool sample.  She states she would like to go home and follow-up with her PCP.  She has narcotic pain medicine already sent in by her PCP.  We will discharge on Bentyl and Zofran.  Recommended a bland diet.  Recommended over-the-counter Imodium as needed.  Patient and husband are comfortable with this plan.   At this time, I do not feel there is any life-threatening condition present. I reviewed all nursing notes, vitals, pertinent previous records.  All lab and urine results, EKGs, imaging ordered have been independently reviewed and interpreted by myself.  I reviewed all available radiology reports from any imaging ordered this visit.  Based on my assessment, I feel the patient is safe to be discharged home without further emergent workup and can continue workup as an outpatient as needed. Discussed all findings, treatment plan as well as usual and customary return precautions.  They verbalize understanding and are comfortable with this plan.  Outpatient follow-up has been provided as needed.  All questions have been answered.    CONSULTS: Admission  considered but patient feeling better with benign abdominal exam and no longer having diarrhea, tolerating p.o.  I feel she is appropriate for further outpatient management.   OUTSIDE RECORDS REVIEWED: Reviewed patient's office visit with Dr. Sabra Heck on 06/22/2022.       FINAL CLINICAL IMPRESSION(S) / ED DIAGNOSES   Final diagnoses:  Infectious diarrhea     Rx / DC Orders   ED Discharge Orders          Ordered    dicyclomine (BENTYL) 20 MG tablet  Every 8 hours PRN        06/25/22 0554    ondansetron (ZOFRAN-ODT) 4 MG disintegrating tablet  Every 6 hours PRN        06/25/22 0554             Note:  This document was prepared using Dragon voice recognition software and may include unintentional dictation errors.   Viva Gallaher, Delice Bison, DO 06/25/22 1108    Amaziah Raisanen, Delice Bison, DO 06/25/22 1109

## 2022-10-13 ENCOUNTER — Other Ambulatory Visit: Payer: Self-pay | Admitting: Neurosurgery

## 2022-10-14 ENCOUNTER — Other Ambulatory Visit: Payer: Self-pay

## 2022-11-06 ENCOUNTER — Ambulatory Visit
Admission: EM | Admit: 2022-11-06 | Discharge: 2022-11-06 | Disposition: A | Discharge status: Home / Self Care | Payer: BC Managed Care – PPO | Attending: Emergency Medicine | Admitting: Emergency Medicine

## 2022-11-06 DIAGNOSIS — J0100 Acute maxillary sinusitis, unspecified: Secondary | ICD-10-CM

## 2022-11-06 DIAGNOSIS — J209 Acute bronchitis, unspecified: Secondary | ICD-10-CM | POA: Diagnosis not present

## 2022-11-06 MED ORDER — AMOXICILLIN-POT CLAVULANATE 875-125 MG PO TABS: 1.0000 | ORAL_TABLET | Freq: Two times a day (BID) | ORAL | 0 refills | Status: AC

## 2022-11-06 MED ORDER — ALBUTEROL SULFATE HFA 108 (90 BASE) MCG/ACT IN AERS: 1.0000 | INHALATION_SPRAY | Freq: Four times a day (QID) | RESPIRATORY_TRACT | 0 refills | Status: AC | PRN

## 2022-11-06 MED ORDER — PREDNISONE 10 MG PO TABS: 40.0000 mg | ORAL_TABLET | Freq: Every day | ORAL | 0 refills | Status: AC

## 2022-11-06 NOTE — ED Triage Notes (Addendum)
Patient to Urgent Care with complaints of sore throat, head congestion, and chest congestion. Reports congestion moved into her chest approx 10 days ago. At times is able to cough up grey/ green mucus. Denies any known fevers. Headache from coughing. Reports waking up last night with some wheezing.   Has been taking mucinex-DM/ emergen-C, nasal spray, netti pot, nyquil. Took 7 amoxicillin and prednisone from a left over prescription.

## 2022-11-06 NOTE — ED Provider Notes (Signed)
Rebecca Barker    CSN: [id number] Arrival date & time: 11/06/22  2353      History   Chief Complaint Chief Complaint  Patient presents with   Cough    HPI Rebecca Barker is a 56 y.o. female.  Patient presents with 10-day history of congestion, sore throat, cough, wheezing.  No fever, shortness of breath, chest pain, vomiting, diarrhea, or other symptoms.  Several OTC treatments attempted; patient has also taken leftover amoxicillin and prednisone that she had from previous illness.  Her medical history includes hypertension, peptic ulcer disease, migraine headaches, lumbar radiculopathy, tobacco abuse.   The history is provided by the patient and medical records.    Past Medical History:  Diagnosis Date   Back pain    Degenerative spondylolisthesis    Migraines    Peptic ulcer    Tobacco abuse     Patient Active Problem List   Diagnosis Date Noted   Benign essential hypertension 12/24/2021   Tubular adenoma 07/12/2018   Lumbar radiculopathy 01/03/2018   Lumbar degenerative disc disease 01/03/2018   Spinal stenosis of lumbar region without neurogenic claudication 01/03/2018   Lumbar disc disease 04/04/2014   Migraine 04/03/2014   PUD (peptic ulcer disease) 04/03/2014   Surgical menopause 04/03/2014    Past Surgical History:  Procedure Laterality Date   CERVICAL FUSION  2008   [place] and 2001   FOOT SURGERY  1980   LAPAROSCOPY      OB History   No obstetric history on file.      Home Medications    Prior to Admission medications   Medication Sig Start Date End Date Taking? Authorizing Provider  albuterol (VENTOLIN HFA) 108 (90 Base) MCG/ACT inhaler Inhale 1-2 puffs into the lungs every 6 (six) hours as needed. 11/06/22  Yes Sharion Balloon, NP  amoxicillin-clavulanate (AUGMENTIN) 875-125 MG tablet Take 1 tablet by mouth every 12 (twelve) hours for 10 days. 11/06/22 11/16/22 Yes Sharion Balloon, NP  predniSONE (DELTASONE) 10 MG  tablet Take 4 tablets (40 mg total) by mouth daily for 5 days. 11/06/22 11/11/22 Yes Sharion Balloon, NP  buPROPion (WELLBUTRIN XL) 150 MG 24 hr tablet Take 150 mg by mouth daily.    [provider]  Cholecalciferol (VITAMIN D-1000 MAX ST) 25 MCG (1000 UT) tablet Take by mouth.    [provider]  cyclobenzaprine (FLEXERIL) 10 MG tablet Take 10 mg by mouth as needed for muscle spasms.    [provider]  dicyclomine (BENTYL) 20 MG tablet Take 1 tablet (20 mg total) by mouth every 8 (eight) hours as needed for spasms (Abdominal cramping). 06/25/22   Ward, Delice Bison, DO  dicyclomine (BENTYL) 20 MG tablet Take 1 tablet by mouth 2 (two) times daily. 08/29/22   [provider]  famotidine (PEPCID) 40 MG tablet Take 1 tablet by mouth at bedtime. 09/06/22   [provider]  fluticasone (FLONASE) 50 MCG/ACT nasal spray Place into the nose. 04/02/18   [provider]  hydrochlorothiazide (HYDRODIURIL) 25 MG tablet Take by mouth. 08/29/22   [provider]  HYDROcodone-acetaminophen (NORCO/VICODIN) 5-325 MG tablet Take 1 tablet by mouth as needed for moderate pain.    [provider]  meloxicam (MOBIC) 7.5 MG tablet Take by mouth. 08/29/22   [provider]  ondansetron (ZOFRAN-ODT) 4 MG disintegrating tablet Take 1 tablet (4 mg total) by mouth every 6 (six) hours as needed for nausea or vomiting. 06/25/22  Ward, Kristen N, DO  pantoprazole (PROTONIX) 40 MG tablet Take 40 mg by mouth daily.    [provider]  phentermine 30 MG capsule Take 1 capsule by mouth daily. 01/08/18 02/07/18  [provider]  Progesterone 40 % CREA Progesterone 4% cream. Apply to wrist once daily 10/12/21   [provider]  sucralfate (CARAFATE) 1 g tablet Take by mouth. 08/29/22 08/29/23  [provider]    Family History Family History  Problem Relation Age of Onset   Arthritis Mother    Hypertension Mother     Miscarriages / [place] Mother    Varicose Veins Mother    COPD Father    Birth defects Brother    Learning disabilities Brother    Mental retardation Brother    Arthritis Maternal Grandmother    Kidney disease Maternal Grandmother    Stroke Maternal Grandmother    Diabetes Maternal Grandfather    Stroke Maternal Grandfather    Arthritis Paternal Grandmother    Cancer Paternal Grandmother    Alcohol abuse Paternal Grandfather     Social History Social History   Tobacco Use   Smoking status: Every Day    Packs/day: 0.50    Years: 15.00    Total pack years: 7.50    Types: Cigarettes    Start date: 2003   Smokeless tobacco: Never  Substance Use Topics   Alcohol use: Not Currently   Drug use: Never     Allergies   Iodine and Betadine [povidone iodine]   Review of Systems Review of Systems  Constitutional:  Negative for chills and fever.  HENT:  Positive for congestion and sore throat. Negative for ear pain.   Respiratory:  Positive for cough. Negative for shortness of breath.   Cardiovascular:  Negative for chest pain and palpitations.  Gastrointestinal:  Negative for diarrhea and vomiting.  Skin:  Negative for color change and rash.  All other systems reviewed and are negative.    Physical Exam Triage Vital Signs ED Triage Vitals  Enc Vitals Group     BP      Pulse      Resp      Temp      Temp src      SpO2      Weight      Height      Head Circumference      Peak Flow      Pain Score      Pain Loc      Pain Edu?      Excl. in [place]?    No data found.  Updated Vital Signs BP (!) 149/83   Pulse 74   Temp 98.7 F (37.1 C)   Resp 18   Ht '5\' 8"'$  (1.727 m)   Wt 198 lb (89.8 kg)   SpO2 98%   BMI 30.11 kg/m   Visual Acuity Right Eye Distance:   Left Eye Distance:   Bilateral Distance:    Right Eye Near:   Left Eye Near:    Bilateral Near:     Physical Exam Vitals and nursing note reviewed.  Constitutional:      General: She is not in  acute distress.    Appearance: Normal appearance. She is well-developed. She is not ill-appearing.  HENT:     Right Ear: Tympanic membrane normal.     Left Ear: Tympanic membrane normal.     Nose: Nose normal.     Mouth/Throat:  Mouth: Mucous membranes are moist.     Pharynx: Oropharynx is clear.  Cardiovascular:     Rate and Rhythm: Normal rate and regular rhythm.     Heart sounds: Normal heart sounds.  Pulmonary:     Effort: Pulmonary effort is normal. No respiratory distress.     Breath sounds: Wheezing and rhonchi present.     Comments: Scattered bilateral rhonchi and wheezes that clear with cough.  Musculoskeletal:     Cervical back: Neck supple.  Skin:    General: Skin is warm and dry.  Neurological:     Mental Status: She is alert.  Psychiatric:        Mood and Affect: Mood normal.        Behavior: Behavior normal.      UC Treatments / Results  Labs (all labs ordered are listed, but only abnormal results are displayed) Labs Reviewed - No data to display  EKG   Radiology No results found.  Procedures Procedures (including critical care time)  Medications Ordered in UC Medications - No data to display  Initial Impression / Assessment and Plan / UC Course  I have reviewed the triage vital signs and the nursing notes.  Pertinent labs & imaging results that were available during my care of the patient were reviewed by me and considered in my medical decision making (see chart for details).    Acute bronchitis and sinusitis.  Patient has been symptomatic for 10 days.  She took a leftover amoxicillin and prednisone at the onset of her symptoms; she had approximately 3 days of these medications.  Treating today with albuterol inhaler, prednisone, and Augmentin.  Instructed patient to follow up with her PCP this week.  She agrees to plan of care.    Final Clinical Impressions(s) / UC Diagnoses   Final diagnoses:  Acute bronchitis, unspecified organism   Acute non-recurrent maxillary sinusitis     Discharge Instructions      Take the prednisone and Augmentin as directed.  Use the albuterol inhaler as directed.  Follow up with your primary care provider this week.        ED Prescriptions     Medication Sig Dispense Auth. Provider   albuterol (VENTOLIN HFA) 108 (90 Base) MCG/ACT inhaler Inhale 1-2 puffs into the lungs every 6 (six) hours as needed. 18 g Sharion Balloon, NP   predniSONE (DELTASONE) 10 MG tablet Take 4 tablets (40 mg total) by mouth daily for 5 days. 20 tablet Sharion Balloon, NP   amoxicillin-clavulanate (AUGMENTIN) 875-125 MG tablet Take 1 tablet by mouth every 12 (twelve) hours for 10 days. 20 tablet Sharion Balloon, NP      PDMP not reviewed this encounter.   Sharion Balloon, NP 11/06/22 [phone]

## 2022-11-06 NOTE — Discharge Instructions (Addendum)
Take the prednisone and Augmentin as directed.  Use the albuterol inhaler as directed.  Follow up with your primary care provider this week.

## 2022-11-22 ENCOUNTER — Inpatient Hospital Stay: Admit: 2022-11-22 | Payer: BC Managed Care – PPO | Admitting: Neurosurgery

## 2022-11-22 SURGERY — ANTERIOR LUMBAR FUSION 1 LEVEL
Anesthesia: General

## 2023-01-23 DIAGNOSIS — F17209 Nicotine dependence, unspecified, with unspecified nicotine-induced disorders: Secondary | ICD-10-CM | POA: Insufficient documentation

## 2023-01-25 ENCOUNTER — Other Ambulatory Visit: Payer: Self-pay

## 2023-01-25 DIAGNOSIS — I10 Essential (primary) hypertension: Secondary | ICD-10-CM

## 2023-01-25 DIAGNOSIS — F17209 Nicotine dependence, unspecified, with unspecified nicotine-induced disorders: Secondary | ICD-10-CM

## 2023-01-25 DIAGNOSIS — F17210 Nicotine dependence, cigarettes, uncomplicated: Secondary | ICD-10-CM

## 2023-01-31 ENCOUNTER — Ambulatory Visit
Admission: RE | Admit: 2023-01-31 | Discharge: 2023-01-31 | Disposition: A | Discharge status: Home / Self Care | Payer: BC Managed Care – PPO | Source: Ambulatory Visit | Attending: Internal Medicine | Admitting: Internal Medicine

## 2023-01-31 DIAGNOSIS — F17210 Nicotine dependence, cigarettes, uncomplicated: Secondary | ICD-10-CM | POA: Diagnosis present

## 2023-01-31 DIAGNOSIS — F17209 Nicotine dependence, unspecified, with unspecified nicotine-induced disorders: Secondary | ICD-10-CM | POA: Insufficient documentation

## 2023-01-31 DIAGNOSIS — I10 Essential (primary) hypertension: Secondary | ICD-10-CM | POA: Insufficient documentation

## 2023-03-21 ENCOUNTER — Other Ambulatory Visit: Payer: Self-pay | Admitting: Neurosurgery

## 2023-03-29 ENCOUNTER — Other Ambulatory Visit: Payer: Self-pay

## 2023-04-11 NOTE — Progress Notes (Signed)
     Your surgery and Pre-Admission testing visit will be at [facility] located at [address], [place], [place] [address].  Please let all your doctors (i.e., Primary Care Physician, Cardiologist, Endocrinologist, Pulmonologist) know you are having surgery. You may need clearance for surgery. If you are on blood thinners, notify your surgeon and ask the doctor who prescribed them how long to hold them before surgery.  If you have had a heart test, such as an EKG, stress test, heart ultrasound, etc., or lab work performed outside of [place], please bring copies of these tests to your Pre-Admission testing, if possible.  These departments may contact you before the day of surgery:  Pre-Service Center - insurance/ billing: [phone] Pharmacy- to review your medications: [phone] Pre-Admission Testing- to set an appointment for your visit: [phone]  (Often, these numbers show up as "SPAM" on your phone)  The Pre-Admission Testing (PAT) visit focuses on Anesthesia for your upcoming surgery.  You do NOT need to fast; take your medications as usual. Please arrive 30 minutes early to allow for parking and admitting.  The visit may last up to an hour. Bring a photo ID and medical insurance card. Reschedule if you are sick. ([phone]) and please, NO children under age 16 at the visit.  During the PAT visit:  We will review your medical and surgical history.   You will receive pre-operative instructions, including the time of arrival at the hospital and surgical start time.  We will review what medication(s) you can take on the day of surgery.  After speaking with the nurse, you will have blood drawn and, if needed, a chest x-ray and EKG.  Most lab results from your doctor are good for 30 days, Hemoglobin A1C is good for 60 days. If you cannot talk to the Pharmacy, bring your medications or a list of them to the PST visit.   Infection control for the [facility] requires: All fingernail and toenail products should be removed before the day of surgery.  (SNS, Acrylic, Gel, Polish, Stickers, Press on, and Poly gel nails.)   Parking information:  Address: [facility] - [address], [place], [place] [address]  Please look for signs for entrance A off of [address]. Free valet parking is available Monday-Friday 05:30am-06:00pm     

## 2023-04-13 ENCOUNTER — Other Ambulatory Visit: Payer: Self-pay

## 2023-04-14 NOTE — Pre-Procedure Instructions (Signed)
Surgical Instructions    Your procedure is scheduled on April 24, 2023.  Report to [facility] Main Entrance "A" at 5:30 A.M., then check in with the Admitting office.  Call this number if you have problems the morning of surgery:  [phone]  If you have any questions prior to your surgery date call [phone]: Open Monday-Friday 8am-4pm If you experience any cold or flu symptoms such as cough, fever, chills, shortness of breath, etc. between now and your scheduled surgery, please notify us at the above number.     Remember:  Do not eat or drink after midnight the night before your surgery     Take these medicines the morning of surgery with A SIP OF WATER:  buPROPion (WELLBUTRIN XL)   dicyclomine (BENTYL)   pantoprazole (PROTONIX)    May take these medicines IF NEEDED:  albuterol (VENTOLIN HFA) inhaler   fluticasone (FLONASE) nasal spray   gabapentin (NEURONTIN)   HYDROcodone-acetaminophen (NORCO/VICODIN)   sucralfate (CARAFATE)    As of today, STOP taking any Aspirin (unless otherwise instructed by your surgeon) Aleve, Naproxen, Ibuprofen, Motrin, Advil, Goody's, BC's, all herbal medications, fish oil, and all vitamins. This includes your medication: meloxicam (MOBIC)                      Do NOT Smoke (Tobacco/Vaping) for 24 hours prior to your procedure.  If you use a CPAP at night, you may bring your mask/headgear for your overnight stay.   Contacts, glasses, piercing's, hearing aid's, dentures or partials may not be worn into surgery, please bring cases for these belongings.    For patients admitted to the hospital, discharge time will be determined by your treatment team.   Patients discharged the day of surgery will not be allowed to drive home, and someone needs to stay with them for 24 hours.  SURGICAL WAITING ROOM VISITATION Patients having surgery or a procedure may have no more than 2 support people in the waiting area - these visitors may rotate.   Children  under the age of 40 must have an adult with them who is not the patient. If the patient needs to stay at the hospital during part of their recovery, the visitor guidelines for inpatient rooms apply. Pre-op nurse will coordinate an appropriate time for 1 support person to accompany patient in pre-op.  This support person may not rotate.   Please refer to the [facility] website for the visitor guidelines for Inpatients (after your surgery is over and you are in a regular room).   If you received a COVID test during your pre-op visit  it is requested that you wear a mask when out in public, stay away from anyone that may not be feeling well and notify your surgeon if you develop symptoms. If you have been in contact with anyone that has tested positive in the last 10 days please notify you surgeon.    Pre-operative 5 CHG Bath Instructions   You can play a key role in reducing the risk of infection after surgery. Your skin needs to be as free of germs as possible. You can reduce the number of germs on your skin by washing with CHG (chlorhexidine gluconate) soap before surgery. CHG is an antiseptic soap that kills germs and continues to kill germs even after washing.   DO NOT use if you have an allergy to chlorhexidine/CHG or antibacterial soaps. If your skin becomes reddened or irritated, stop using the CHG and notify  one of our RNs at [phone].   Please shower with the CHG soap starting 4 days before surgery using the following schedule:     Please keep in mind the following:  DO NOT shave, including legs and underarms, starting the day of your first shower.   You may shave your face at any point before/day of surgery.  Place clean sheets on your bed the day you start using CHG soap. Use a clean washcloth (not used since being washed) for each shower. DO NOT sleep with pets once you start using the CHG.   CHG Shower Instructions:  If you choose to wash your hair and private area, wash  first with your normal shampoo/soap.  After you use shampoo/soap, rinse your hair and body thoroughly to remove shampoo/soap residue.  Turn the water OFF and apply about 3 tablespoons (45 ml) of CHG soap to a CLEAN washcloth.  Apply CHG soap ONLY FROM YOUR NECK DOWN TO YOUR TOES (washing for 3-5 minutes)  DO NOT use CHG soap on face, private areas, open wounds, or sores.  Pay special attention to the area where your surgery is being performed.  If you are having back surgery, having someone wash your back for you may be helpful. Wait 2 minutes after CHG soap is applied, then you may rinse off the CHG soap.  Pat dry with a clean towel  Put on clean clothes/pajamas   If you choose to wear lotion, please use ONLY the CHG-compatible lotions on the back of this paper.     Additional instructions for the day of surgery: DO NOT APPLY any lotions, deodorants, cologne, or perfumes. Do not wear jewelry or makeup   Do not wear nail polish, gel polish, artificial nails, or any other type of covering on natural nails (fingers and toes) Do not bring valuables to the hospital. [facility] is not responsible for any belongings or valuables. Put on clean/comfortable clothes.  Brush your teeth.  Ask your nurse before applying any prescription medications to the skin.      CHG Compatible Lotions   Aveeno Moisturizing lotion  Cetaphil Moisturizing Cream  Cetaphil Moisturizing Lotion  Clairol Herbal Essence Moisturizing Lotion, Dry Skin  Clairol Herbal Essence Moisturizing Lotion, Extra Dry Skin  Clairol Herbal Essence Moisturizing Lotion, Normal Skin  Curel Age Defying Therapeutic Moisturizing Lotion with Alpha Hydroxy  Curel Extreme Care Body Lotion  Curel Soothing Hands Moisturizing Hand Lotion  Curel Therapeutic Moisturizing Cream, Fragrance-Free  Curel Therapeutic Moisturizing Lotion, Fragrance-Free  Curel Therapeutic Moisturizing Lotion, Original Formula  Eucerin Daily Replenishing Lotion   Eucerin Dry Skin Therapy Plus Alpha Hydroxy Crme  Eucerin Dry Skin Therapy Plus Alpha Hydroxy Lotion  Eucerin Original Crme  Eucerin Original Lotion  Eucerin Plus Crme Eucerin Plus Lotion  Eucerin TriLipid Replenishing Lotion  Keri Anti-Bacterial Hand Lotion  Keri Deep Conditioning Original Lotion Dry Skin Formula Softly Scented  Keri Deep Conditioning Original Lotion, Fragrance Free Sensitive Skin Formula  Keri Lotion Fast Absorbing Fragrance Free Sensitive Skin Formula  Keri Lotion Fast Absorbing Softly Scented Dry Skin Formula  Keri Original Lotion  Keri Skin Renewal Lotion Keri Silky Smooth Lotion  Keri Silky Smooth Sensitive Skin Lotion  Nivea Body Creamy Conditioning Oil  Nivea Body Extra Enriched Teacher, adult education Moisturizing Lotion Nivea Crme  Nivea Skin Firming Lotion  NutraDerm 30 Skin Lotion  NutraDerm Skin Lotion  NutraDerm Therapeutic Skin Cream  NutraDerm Therapeutic Skin Lotion  ProShield Protective  Hand Cream  Provon moisturizing lotion   Please read over the following fact sheets that you were given.

## 2023-04-17 ENCOUNTER — Encounter (HOSPITAL_COMMUNITY): Payer: Self-pay

## 2023-04-17 ENCOUNTER — Encounter (HOSPITAL_COMMUNITY)
Admission: RE | Admit: 2023-04-17 | Discharge: 2023-04-17 | Disposition: A | Discharge status: Home / Self Care | Payer: BC Managed Care – PPO | Source: Ambulatory Visit | Attending: Neurosurgery | Admitting: Neurosurgery

## 2023-04-17 ENCOUNTER — Other Ambulatory Visit: Payer: Self-pay

## 2023-04-17 VITALS — BP 118/64 | HR 87 | Temp 98.5°F | Resp 18 | Ht 68.0 in | Wt 204.1 lb

## 2023-04-17 DIAGNOSIS — I2510 Atherosclerotic heart disease of native coronary artery without angina pectoris: Secondary | ICD-10-CM | POA: Diagnosis not present

## 2023-04-17 DIAGNOSIS — I4510 Unspecified right bundle-branch block: Secondary | ICD-10-CM | POA: Insufficient documentation

## 2023-04-17 DIAGNOSIS — Z01818 Encounter for other preprocedural examination: Secondary | ICD-10-CM | POA: Insufficient documentation

## 2023-04-17 HISTORY — DX: Nausea with vomiting, unspecified: Z98.890

## 2023-04-17 HISTORY — DX: Essential (primary) hypertension: I10

## 2023-04-17 LAB — CBC
HCT: 42.8 % (ref 36.0–46.0)
Hemoglobin: 14.3 g/dL (ref 12.0–15.0)
MCH: 29.1 pg (ref 26.0–34.0)
MCHC: 33.4 g/dL (ref 30.0–36.0)
MCV: 87 fL (ref 80.0–100.0)
Platelets: 473 10*3/uL — ABNORMAL HIGH (ref 150–400)
RBC: 4.92 MIL/uL (ref 3.87–5.11)
RDW: 13 % (ref 11.5–15.5)
WBC: 9.5 10*3/uL (ref 4.0–10.5)
nRBC: 0 % (ref 0.0–0.2)

## 2023-04-17 LAB — BASIC METABOLIC PANEL
Anion gap: 10 (ref 5–15)
BUN: 9 mg/dL (ref 6–20)
CO2: 26 mmol/L (ref 22–32)
Calcium: 9.3 mg/dL (ref 8.9–10.3)
Chloride: 103 mmol/L (ref 98–111)
Creatinine, Ser: 0.89 mg/dL (ref 0.44–1.00)
GFR, Estimated: >60 mL/min (ref >60)
Glucose, Bld: 104 mg/dL — ABNORMAL HIGH (ref 70–99)
Potassium: 3.6 mmol/L (ref 3.5–5.1)
Sodium: 139 mmol/L (ref 135–145)

## 2023-04-17 LAB — TYPE AND SCREEN
ABO/RH(D): O POS
Antibody Screen: NEGATIVE

## 2023-04-17 LAB — SURGICAL PCR SCREEN
MRSA, PCR: NEGATIVE
Staphylococcus aureus: NEGATIVE

## 2023-04-17 NOTE — Progress Notes (Signed)
PCP - Dr. Danella Penton Cardiologist - Denies  PPM/ICD - Denies Device Orders - n/a Rep Notified - n/a  Chest x-ray - Denies EKG - 04/17/2023 Stress Test - Denies ECHO - Denies Cardiac Cath - Denies  Sleep Study - Denies CPAP - n/a  No DM  Last dose of GLP1 agonist- n/a GLP1 instructions: n/a  Blood Thinner Instructions: n/a Aspirin Instructions: n/a  NPO after midnight  COVID TEST- n/a   Anesthesia review: Yes. EKG review   Patient denies shortness of breath, fever, cough and chest pain at PAT appointment. Pt had sinus infection at the beginning of May. She finished her augmentin prescription two weeks ago and has been symptom free for 9 days. Discussed with Antionette Poles, PA-C.   All instructions explained to the patient, with a verbal understanding of the material. Patient agrees to go over the instructions while at home for a better understanding. Patient also instructed to self quarantine after being tested for COVID-19. The opportunity to ask questions was provided.

## 2023-04-18 ENCOUNTER — Ambulatory Visit: Payer: BC Managed Care – PPO | Admitting: Vascular Surgery

## 2023-04-18 ENCOUNTER — Encounter: Payer: Self-pay | Admitting: Vascular Surgery

## 2023-04-18 VITALS — BP 122/80 | HR 79 | Temp 97.6°F | Resp 14 | Ht 68.0 in | Wt 202.0 lb

## 2023-04-18 DIAGNOSIS — M5416 Radiculopathy, lumbar region: Secondary | ICD-10-CM

## 2023-04-18 NOTE — Progress Notes (Addendum)
Patient name: Rebecca Barker MRN: [id number] DOB: November 27, 1965 Sex: female  REASON FOR CONSULT: Abdominal exposure for L5-S1 ALIF  HPI: Rebecca Barker is a 57 y.o. female, with hx HTN, DDD that presents for evaluation of anterior spine exposure for L5-S1 ALIF.  She has mechanical lumbar pain with intermittent lower extremity radiculopathy and this is worse in the right leg.  She has failed conservative management.  She has been evaluated by Dr. 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  Dr. Jordan Likes has recommended L5-S1 ALIF.  Previous abdominal surgery includes C-section x 2 and hysterectomy.  Past Medical History:  Diagnosis Date   Back pain    Degenerative spondylolisthesis    Hypertension    Migraines    Peptic ulcer    PONV (postoperative nausea and vomiting)    Tobacco abuse     Past Surgical History:  Procedure Laterality Date   ABDOMINAL HYSTERECTOMY  2007   CERVICAL FUSION  2008   CESAREAN SECTION  1999 and 2001   COLONOSCOPY     DILATION AND CURETTAGE OF UTERUS     x3   FOOT SURGERY  1980   LAPAROSCOPY      Family History  Problem Relation Age of Onset   Arthritis Mother    Hypertension Mother    Miscarriages / [place] Mother    Varicose Veins Mother    COPD Father    Birth defects Brother    Learning disabilities Brother    Mental retardation Brother    Arthritis Maternal Grandmother    Kidney disease Maternal Grandmother    Stroke Maternal Grandmother    Diabetes Maternal Grandfather    Stroke Maternal Grandfather    Arthritis Paternal Grandmother    Cancer Paternal Grandmother    Alcohol abuse Paternal Grandfather     SOCIAL HISTORY: Social History   Socioeconomic History   Marital status: Married    Spouse name: Not on file   Number of children: Not on file   Years of education: Not on file   Highest education level: Not on file  Occupational History   Not on file  Tobacco Use    Smoking status: Every Day    Packs/day: 0.25    Years: 15.00    Additional pack years: 0.00    Total pack years: 3.75    Types: Cigarettes    Start date: 2003   Smokeless tobacco: Never   Tobacco comments:    2 per day once starting [place]ix May 2024  Vaping Use   Vaping Use: Never used  Substance and Sexual Activity   Alcohol use: Not Currently   Drug use: Never   Sexual activity: Yes  Other Topics Concern   Not on file  Social History Narrative   Not on file   Social Determinants of Health   Financial Resource Strain: Not on file  Food Insecurity: Not on file  Transportation Needs: Not on file  Physical Activity: Not on file  Stress: Not on file  Social Connections: Not on file  Intimate Partner Violence: Not on file    Allergies  Allergen Reactions   Iodine Rash    topical   Betadine [Povidone Iodine] Rash    Current Outpatient Medications  Medication Sig Dispense Refill   albuterol (VENTOLIN HFA) 108 (90 Base) MCG/ACT inhaler Inhale 1-2 puffs into the lungs every 6 (six) hours as needed. 18 g 0   buPROPion (WELLBUTRIN XL) 150 MG  24 hr tablet Take 150 mg by mouth daily.     DHEA 50 MG CAPS Take 50 mg by mouth in the morning and at bedtime.     dicyclomine (BENTYL) 20 MG tablet Take 1 tablet (20 mg total) by mouth every 8 (eight) hours as needed for spasms (Abdominal cramping). (Patient taking differently: Take 20 mg by mouth 2 (two) times daily.) 20 tablet 0   famotidine (PEPCID) 40 MG tablet Take 40 mg by mouth at bedtime.     fluticasone (FLONASE) 50 MCG/ACT nasal spray Place 1 spray into the nose at bedtime as needed for allergies.     gabapentin (NEURONTIN) 300 MG capsule Take 300 mg by mouth at bedtime as needed (back pain).     hydrochlorothiazide (HYDRODIURIL) 25 MG tablet Take 25 mg by mouth daily as needed (For BP (June - Jan)). Does Not take with Lisinopril HCTZ     HYDROcodone-acetaminophen (NORCO/VICODIN) 5-325 MG tablet Take 1 tablet by mouth every 6  (six) hours as needed for severe pain.     losartan-hydrochlorothiazide (HYZAAR) 50-12.5 MG tablet Take 1 tablet by mouth daily.     meloxicam (MOBIC) 7.5 MG tablet Take 7.5 mg by mouth daily as needed for pain.     NONFORMULARY OR COMPOUNDED ITEM Apply 1 application  topically daily. Progesterone 4% Cream     NONFORMULARY OR COMPOUNDED ITEM Apply 1 application  topically daily. Biest Estrogen Cream 30 Biest 1 mg (70/30) Hrtcre     OVER THE COUNTER MEDICATION Take 1 capsule by mouth in the morning and at bedtime. DIM Supplement Estrogen Balance (Diindolylmethane)     pantoprazole (PROTONIX) 40 MG tablet Take 40 mg by mouth daily.     sucralfate (CARAFATE) 1 g tablet Take 1 g by mouth 2 (two) times daily as needed (stomach ulcer).     varenicline (CHANTIX) 1 MG tablet Take 1 mg by mouth daily as needed for smoking cessation.     No current facility-administered medications for this visit.    REVIEW OF SYSTEMS:  [X]  denotes positive finding, [ ]  denotes negative finding Cardiac  Comments:  Chest pain or chest pressure:    Shortness of breath upon exertion:    Short of breath when lying flat:    Irregular heart rhythm:        Vascular    Pain in calf, thigh, or hip brought on by ambulation:    Pain in feet at night that wakes you up from your sleep:     Blood clot in your veins:    Leg swelling:         Pulmonary    Oxygen at home:    Productive cough:     Wheezing:         Neurologic    Sudden weakness in arms or legs:     Sudden numbness in arms or legs:     Sudden onset of difficulty speaking or slurred speech:    Temporary loss of vision in one eye:     Problems with dizziness:         Gastrointestinal    Blood in stool:     Vomited blood:         Genitourinary    Burning when urinating:     Blood in urine:        Psychiatric    Major depression:         Hematologic    Bleeding problems:    Problems with blood  clotting too easily:        Skin    Rashes or  ulcers:        Constitutional    Fever or chills:      PHYSICAL EXAM: There were no vitals filed for this visit.  GENERAL: The patient is a well-nourished female, in no acute distress. The vital signs are documented above. CARDIAC: There is a regular rate and rhythm.  VASCULAR:  Bilateral femoral pulses palpable Bilateral DP pulses palpable PULMONARY: No respiratory distress. ABDOMEN: Soft and non-tender.  Previous lower transverse incision from C-section and hysterectomy. MUSCULOSKELETAL: There are no major deformities or cyanosis. NEUROLOGIC: No focal weakness or paresthesias are detected. SKIN: There are no ulcers or rashes noted. PSYCHIATRIC: The patient has a normal affect.  DATA:   MRI reviewed    Assessment/Plan:  57 y.o. female, with hx HTN and DDD that presents for evaluation of anterior spine exposure for L5-S1 ALIF.  She has mechanical lumbar pain with intermittent lower extremity radiculopathy worse in the right leg.  She has failed conservative management.  Dr. 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   I reviewed her MRI imaging.  I discussed transverse incision over the left rectus muscle at the L5-S1 disc space.  I discussed mobilizing the rectus muscle and entering in the retroperitoneum to mobilize peritoneum and left ureter across midline.  Discussed mobilizing iliac artery and vein to get the disc space exposed from the front.  Discussed risk of injury to the above structures.  Look forward to assisting Dr. Jordan Likes.  All questions answered.  She is scheduled for Monday.   Cephus Shelling, MD [facility] Office: [phone]

## 2023-04-24 ENCOUNTER — Other Ambulatory Visit: Payer: Self-pay

## 2023-04-24 ENCOUNTER — Inpatient Hospital Stay (HOSPITAL_COMMUNITY): Payer: BC Managed Care – PPO

## 2023-04-24 ENCOUNTER — Inpatient Hospital Stay (HOSPITAL_COMMUNITY)
Admission: RE | Admit: 2023-04-24 | Discharge: 2023-04-25 | DRG: 460 | Disposition: A | Discharge status: Home / Self Care | Payer: BC Managed Care – PPO | Attending: Neurosurgery | Admitting: Neurosurgery

## 2023-04-24 ENCOUNTER — Encounter (HOSPITAL_COMMUNITY)
Admission: RE | Disposition: A | Discharge status: Home / Self Care | Payer: Self-pay | Source: Home / Self Care | Attending: Neurosurgery

## 2023-04-24 ENCOUNTER — Inpatient Hospital Stay (HOSPITAL_COMMUNITY): Payer: BC Managed Care – PPO | Admitting: Physician Assistant

## 2023-04-24 ENCOUNTER — Encounter (HOSPITAL_COMMUNITY): Payer: Self-pay | Admitting: Neurosurgery

## 2023-04-24 DIAGNOSIS — M5117 Intervertebral disc disorders with radiculopathy, lumbosacral region: Secondary | ICD-10-CM | POA: Diagnosis present

## 2023-04-24 DIAGNOSIS — M4310 Spondylolisthesis, site unspecified: Principal | ICD-10-CM | POA: Diagnosis present

## 2023-04-24 DIAGNOSIS — Z8261 Family history of arthritis: Secondary | ICD-10-CM

## 2023-04-24 DIAGNOSIS — F17210 Nicotine dependence, cigarettes, uncomplicated: Secondary | ICD-10-CM | POA: Diagnosis present

## 2023-04-24 DIAGNOSIS — Z91041 Radiographic dye allergy status: Secondary | ICD-10-CM

## 2023-04-24 DIAGNOSIS — G8929 Other chronic pain: Secondary | ICD-10-CM | POA: Diagnosis present

## 2023-04-24 DIAGNOSIS — Z823 Family history of stroke: Secondary | ICD-10-CM | POA: Diagnosis not present

## 2023-04-24 DIAGNOSIS — Z791 Long term (current) use of non-steroidal anti-inflammatories (NSAID): Secondary | ICD-10-CM

## 2023-04-24 DIAGNOSIS — M4317 Spondylolisthesis, lumbosacral region: Principal | ICD-10-CM | POA: Diagnosis present

## 2023-04-24 DIAGNOSIS — Z8249 Family history of ischemic heart disease and other diseases of the circulatory system: Secondary | ICD-10-CM

## 2023-04-24 DIAGNOSIS — Z79899 Other long term (current) drug therapy: Secondary | ICD-10-CM

## 2023-04-24 DIAGNOSIS — Z810 Family history of intellectual disabilities: Secondary | ICD-10-CM | POA: Diagnosis not present

## 2023-04-24 DIAGNOSIS — M4807 Spinal stenosis, lumbosacral region: Secondary | ICD-10-CM | POA: Diagnosis present

## 2023-04-24 DIAGNOSIS — I10 Essential (primary) hypertension: Secondary | ICD-10-CM | POA: Diagnosis present

## 2023-04-24 DIAGNOSIS — Z833 Family history of diabetes mellitus: Secondary | ICD-10-CM

## 2023-04-24 DIAGNOSIS — Z888 Allergy status to other drugs, medicaments and biological substances status: Secondary | ICD-10-CM | POA: Diagnosis not present

## 2023-04-24 DIAGNOSIS — Z825 Family history of asthma and other chronic lower respiratory diseases: Secondary | ICD-10-CM | POA: Diagnosis not present

## 2023-04-24 DIAGNOSIS — Z90710 Acquired absence of both cervix and uterus: Secondary | ICD-10-CM | POA: Diagnosis not present

## 2023-04-24 HISTORY — PX: ABDOMINAL EXPOSURE: SHX5708

## 2023-04-24 HISTORY — PX: ANTERIOR LUMBAR FUSION: SHX1170

## 2023-04-24 LAB — ABO/RH: ABO/RH(D): O POS

## 2023-04-24 SURGERY — ANTERIOR LUMBAR FUSION 1 LEVEL
Anesthesia: General

## 2023-04-24 MED ORDER — OXYCODONE HCL 5 MG PO TABS: 5.0000 mg | ORAL_TABLET | Freq: Once | ORAL | Status: DC | PRN

## 2023-04-24 MED ORDER — FLUTICASONE PROPIONATE 50 MCG/ACT NA SUSP: 1.0000 | Freq: Every evening | NASAL | Status: DC | PRN

## 2023-04-24 MED ORDER — MIDAZOLAM HCL 2 MG/2ML IJ SOLN
INTRAMUSCULAR | Status: DC | PRN
  Administered 2023-04-24: 2 mg via INTRAVENOUS

## 2023-04-24 MED ORDER — PHENYLEPHRINE 80 MCG/ML (10ML) SYRINGE FOR IV PUSH (FOR BLOOD PRESSURE SUPPORT)
PREFILLED_SYRINGE | INTRAVENOUS | Status: DC | PRN
  Administered 2023-04-24: 80 ug via INTRAVENOUS
  Administered 2023-04-24: 120 ug via INTRAVENOUS
  Administered 2023-04-24: 40 ug via INTRAVENOUS
  Administered 2023-04-24: 80 ug via INTRAVENOUS

## 2023-04-24 MED ORDER — GABAPENTIN 300 MG PO CAPS
300.0000 mg | ORAL_CAPSULE | Freq: Every evening | ORAL | Status: DC | PRN
  Administered 2023-04-24: 300 mg via ORAL
  Filled 2023-04-24: qty 1

## 2023-04-24 MED ORDER — ACETAMINOPHEN 650 MG RE SUPP: 650.0000 mg | RECTAL | Status: DC | PRN

## 2023-04-24 MED ORDER — SODIUM CHLORIDE 0.9 % IV SOLN
250.0000 mL | INTRAVENOUS | Status: DC
  Administered 2023-04-24: 250 mL via INTRAVENOUS

## 2023-04-24 MED ORDER — MUPIROCIN 2 % EX OINT
1.0000 | TOPICAL_OINTMENT | Freq: Once | CUTANEOUS | Status: AC
  Administered 2023-04-24: 1 via TOPICAL
  Filled 2023-04-24: qty 22

## 2023-04-24 MED ORDER — DIAZEPAM 5 MG PO TABS
5.0000 mg | ORAL_TABLET | Freq: Four times a day (QID) | ORAL | Status: DC | PRN
  Administered 2023-04-24 – 2023-04-25 (×3): 5 mg via ORAL
  Filled 2023-04-24 (×3): qty 1

## 2023-04-24 MED ORDER — 0.9 % SODIUM CHLORIDE (POUR BTL) OPTIME
TOPICAL | Status: DC | PRN
  Administered 2023-04-24: 1000 mL

## 2023-04-24 MED ORDER — MENTHOL 3 MG MT LOZG: 1.0000 | LOZENGE | OROMUCOSAL | Status: DC | PRN

## 2023-04-24 MED ORDER — CHLORHEXIDINE GLUCONATE CLOTH 2 % EX PADS: 6.0000 | MEDICATED_PAD | Freq: Once | CUTANEOUS | Status: DC

## 2023-04-24 MED ORDER — LOSARTAN POTASSIUM 50 MG PO TABS: 50.0000 mg | ORAL_TABLET | Freq: Every day | ORAL | Status: DC

## 2023-04-24 MED ORDER — HYDROCHLOROTHIAZIDE 25 MG PO TABS: 25.0000 mg | ORAL_TABLET | Freq: Every day | ORAL | Status: DC | PRN

## 2023-04-24 MED ORDER — MIDAZOLAM HCL 2 MG/2ML IJ SOLN
INTRAMUSCULAR | Status: AC
  Filled 2023-04-24: qty 2

## 2023-04-24 MED ORDER — BUPROPION HCL ER (XL) 150 MG PO TB24
150.0000 mg | ORAL_TABLET | Freq: Every day | ORAL | Status: DC
  Administered 2023-04-25: 150 mg via ORAL
  Filled 2023-04-24: qty 1

## 2023-04-24 MED ORDER — ONDANSETRON HCL 4 MG/2ML IJ SOLN
INTRAMUSCULAR | Status: DC | PRN
  Administered 2023-04-24: 4 mg via INTRAVENOUS

## 2023-04-24 MED ORDER — KETOROLAC TROMETHAMINE 30 MG/ML IJ SOLN: 30.0000 mg | Freq: Once | INTRAMUSCULAR | Status: DC | PRN

## 2023-04-24 MED ORDER — FLEET ENEMA 7-19 GM/118ML RE ENEM: 1.0000 | ENEMA | Freq: Once | RECTAL | Status: DC | PRN

## 2023-04-24 MED ORDER — ORAL CARE MOUTH RINSE: 15.0000 mL | Freq: Once | OROMUCOSAL | Status: AC

## 2023-04-24 MED ORDER — HYDROMORPHONE HCL 1 MG/ML IJ SOLN
INTRAMUSCULAR | Status: AC
  Filled 2023-04-24: qty 1

## 2023-04-24 MED ORDER — FAMOTIDINE 20 MG PO TABS
40.0000 mg | ORAL_TABLET | Freq: Every day | ORAL | Status: DC
  Administered 2023-04-24: 40 mg via ORAL
  Filled 2023-04-24: qty 2

## 2023-04-24 MED ORDER — ACETAMINOPHEN 10 MG/ML IV SOLN
1000.0000 mg | Freq: Once | INTRAVENOUS | Status: AC
  Administered 2023-04-24: 1000 mg via INTRAVENOUS

## 2023-04-24 MED ORDER — ONDANSETRON HCL 4 MG/2ML IJ SOLN
4.0000 mg | Freq: Four times a day (QID) | INTRAMUSCULAR | Status: DC | PRN
  Administered 2023-04-24: 4 mg via INTRAVENOUS
  Filled 2023-04-24: qty 2

## 2023-04-24 MED ORDER — POLYETHYLENE GLYCOL 3350 17 G PO PACK: 17.0000 g | PACK | Freq: Every day | ORAL | Status: DC | PRN

## 2023-04-24 MED ORDER — BUPIVACAINE HCL (PF) 0.25 % IJ SOLN
INTRAMUSCULAR | Status: AC
  Filled 2023-04-24: qty 30

## 2023-04-24 MED ORDER — ROCURONIUM BROMIDE 10 MG/ML (PF) SYRINGE
PREFILLED_SYRINGE | INTRAVENOUS | Status: AC
  Filled 2023-04-24: qty 10

## 2023-04-24 MED ORDER — PROPOFOL 10 MG/ML IV BOLUS
INTRAVENOUS | Status: DC | PRN
  Administered 2023-04-24: 140 mg via INTRAVENOUS

## 2023-04-24 MED ORDER — ONDANSETRON HCL 4 MG/2ML IJ SOLN: 4.0000 mg | Freq: Once | INTRAMUSCULAR | Status: DC | PRN

## 2023-04-24 MED ORDER — OXYCODONE HCL 5 MG PO TABS
10.0000 mg | ORAL_TABLET | ORAL | Status: DC | PRN
  Administered 2023-04-24 – 2023-04-25 (×6): 10 mg via ORAL
  Filled 2023-04-24 (×6): qty 2

## 2023-04-24 MED ORDER — ONDANSETRON HCL 4 MG PO TABS: 4.0000 mg | ORAL_TABLET | Freq: Four times a day (QID) | ORAL | Status: DC | PRN

## 2023-04-24 MED ORDER — HYDROCHLOROTHIAZIDE 12.5 MG PO TABS: 12.5000 mg | ORAL_TABLET | Freq: Every day | ORAL | Status: DC

## 2023-04-24 MED ORDER — LIDOCAINE 2% (20 MG/ML) 5 ML SYRINGE
INTRAMUSCULAR | Status: DC | PRN
  Administered 2023-04-24: 100 mg via INTRAVENOUS

## 2023-04-24 MED ORDER — CEFAZOLIN SODIUM-DEXTROSE 1-4 GM/50ML-% IV SOLN
1.0000 g | Freq: Three times a day (TID) | INTRAVENOUS | Status: AC
  Administered 2023-04-24 (×2): 1 g via INTRAVENOUS
  Filled 2023-04-24 (×2): qty 50

## 2023-04-24 MED ORDER — SODIUM CHLORIDE 0.9% FLUSH: 3.0000 mL | INTRAVENOUS | Status: DC | PRN

## 2023-04-24 MED ORDER — PHENOL 1.4 % MT LIQD: 1.0000 | OROMUCOSAL | Status: DC | PRN

## 2023-04-24 MED ORDER — BISACODYL 10 MG RE SUPP: 10.0000 mg | Freq: Every day | RECTAL | Status: DC | PRN

## 2023-04-24 MED ORDER — CEFAZOLIN SODIUM-DEXTROSE 2-4 GM/100ML-% IV SOLN
2.0000 g | INTRAVENOUS | Status: AC
  Administered 2023-04-24: 2 g via INTRAVENOUS
  Filled 2023-04-24: qty 100

## 2023-04-24 MED ORDER — HYDROMORPHONE HCL 1 MG/ML IJ SOLN
0.2500 mg | INTRAMUSCULAR | Status: DC | PRN
  Administered 2023-04-24 (×3): 0.5 mg via INTRAVENOUS

## 2023-04-24 MED ORDER — PHENYLEPHRINE HCL-NACL 20-0.9 MG/250ML-% IV SOLN
INTRAVENOUS | Status: DC | PRN
  Administered 2023-04-24: 30 ug/min via INTRAVENOUS
  Administered 2023-04-24: 20 ug/min via INTRAVENOUS

## 2023-04-24 MED ORDER — SODIUM CHLORIDE 0.9% FLUSH
3.0000 mL | Freq: Two times a day (BID) | INTRAVENOUS | Status: DC
  Administered 2023-04-24 (×2): 3 mL via INTRAVENOUS

## 2023-04-24 MED ORDER — FENTANYL CITRATE (PF) 250 MCG/5ML IJ SOLN
INTRAMUSCULAR | Status: AC
  Filled 2023-04-24: qty 5

## 2023-04-24 MED ORDER — LIDOCAINE 2% (20 MG/ML) 5 ML SYRINGE
INTRAMUSCULAR | Status: AC
  Filled 2023-04-24: qty 5

## 2023-04-24 MED ORDER — ACETAMINOPHEN 10 MG/ML IV SOLN
INTRAVENOUS | Status: AC
  Filled 2023-04-24: qty 100

## 2023-04-24 MED ORDER — LACTATED RINGERS IV SOLN: INTRAVENOUS | Status: DC

## 2023-04-24 MED ORDER — PROPOFOL 10 MG/ML IV BOLUS
INTRAVENOUS | Status: AC
  Filled 2023-04-24: qty 20

## 2023-04-24 MED ORDER — HYDROMORPHONE HCL 1 MG/ML IJ SOLN: 1.0000 mg | INTRAMUSCULAR | Status: DC | PRN

## 2023-04-24 MED ORDER — LOSARTAN POTASSIUM-HCTZ 50-12.5 MG PO TABS: 1.0000 | ORAL_TABLET | Freq: Every day | ORAL | Status: DC

## 2023-04-24 MED ORDER — ACETAMINOPHEN 500 MG PO TABS: 1000.0000 mg | ORAL_TABLET | Freq: Once | ORAL | Status: DC

## 2023-04-24 MED ORDER — DEXAMETHASONE SODIUM PHOSPHATE 10 MG/ML IJ SOLN
INTRAMUSCULAR | Status: DC | PRN
  Administered 2023-04-24: 10 mg via INTRAVENOUS

## 2023-04-24 MED ORDER — FENTANYL CITRATE (PF) 250 MCG/5ML IJ SOLN
INTRAMUSCULAR | Status: DC | PRN
  Administered 2023-04-24: 50 ug via INTRAVENOUS
  Administered 2023-04-24: 100 ug via INTRAVENOUS

## 2023-04-24 MED ORDER — ONDANSETRON HCL 4 MG/2ML IJ SOLN
INTRAMUSCULAR | Status: AC
  Filled 2023-04-24: qty 2

## 2023-04-24 MED ORDER — PANTOPRAZOLE SODIUM 40 MG PO TBEC
40.0000 mg | DELAYED_RELEASE_TABLET | Freq: Every day | ORAL | Status: DC
  Administered 2023-04-25: 40 mg via ORAL
  Filled 2023-04-24: qty 1

## 2023-04-24 MED ORDER — HYDROCODONE-ACETAMINOPHEN 10-325 MG PO TABS: 1.0000 | ORAL_TABLET | ORAL | Status: DC | PRN

## 2023-04-24 MED ORDER — CHLORHEXIDINE GLUCONATE 0.12 % MT SOLN
15.0000 mL | Freq: Once | OROMUCOSAL | Status: AC
  Administered 2023-04-24: 15 mL via OROMUCOSAL
  Filled 2023-04-24: qty 15

## 2023-04-24 MED ORDER — LACTATED RINGERS IV SOLN: INTRAVENOUS | Status: DC | PRN

## 2023-04-24 MED ORDER — THROMBIN 20000 UNITS EX SOLR
CUTANEOUS | Status: AC
  Filled 2023-04-24: qty 20000

## 2023-04-24 MED ORDER — ROCURONIUM BROMIDE 10 MG/ML (PF) SYRINGE
PREFILLED_SYRINGE | INTRAVENOUS | Status: DC | PRN
  Administered 2023-04-24: 100 mg via INTRAVENOUS

## 2023-04-24 MED ORDER — SENNOSIDES-DOCUSATE SODIUM 8.6-50 MG PO TABS
1.0000 | ORAL_TABLET | Freq: Two times a day (BID) | ORAL | Status: DC
  Administered 2023-04-24 – 2023-04-25 (×2): 1 via ORAL
  Filled 2023-04-24 (×2): qty 1

## 2023-04-24 MED ORDER — HYDROCODONE-ACETAMINOPHEN 5-325 MG PO TABS: 1.0000 | ORAL_TABLET | Freq: Four times a day (QID) | ORAL | Status: DC | PRN

## 2023-04-24 MED ORDER — BUPIVACAINE HCL (PF) 0.25 % IJ SOLN
INTRAMUSCULAR | Status: DC | PRN
  Administered 2023-04-24: 20 mL

## 2023-04-24 MED ORDER — ALBUTEROL SULFATE (2.5 MG/3ML) 0.083% IN NEBU: 2.5000 mg | INHALATION_SOLUTION | Freq: Four times a day (QID) | RESPIRATORY_TRACT | Status: DC | PRN

## 2023-04-24 MED ORDER — OXYCODONE HCL 5 MG/5ML PO SOLN: 5.0000 mg | Freq: Once | ORAL | Status: DC | PRN

## 2023-04-24 MED ORDER — SCOPOLAMINE 1 MG/3DAYS TD PT72
MEDICATED_PATCH | TRANSDERMAL | Status: DC | PRN
  Administered 2023-04-24: 1 via TRANSDERMAL

## 2023-04-24 MED ORDER — THROMBIN 20000 UNITS EX SOLR
CUTANEOUS | Status: DC | PRN
  Administered 2023-04-24: 20 mL via TOPICAL

## 2023-04-24 MED ORDER — DEXAMETHASONE SODIUM PHOSPHATE 10 MG/ML IJ SOLN
INTRAMUSCULAR | Status: AC
  Filled 2023-04-24: qty 1

## 2023-04-24 MED ORDER — ACETAMINOPHEN 325 MG PO TABS
650.0000 mg | ORAL_TABLET | ORAL | Status: DC | PRN
  Administered 2023-04-24 – 2023-04-25 (×3): 650 mg via ORAL
  Filled 2023-04-24 (×3): qty 2

## 2023-04-24 MED ORDER — SUGAMMADEX SODIUM 200 MG/2ML IV SOLN
INTRAVENOUS | Status: DC | PRN
  Administered 2023-04-24: 200 mg via INTRAVENOUS

## 2023-04-24 SURGICAL SUPPLY — 78 items
ADH SKN CLS APL DERMABOND .7 (GAUZE/BANDAGES/DRESSINGS) ×1
ANCH SPNL 25 LMBR MIS (Anchor) ×3 IMPLANT
ANCHOR LUMBAR 25 MIS (Anchor) IMPLANT
APL SKNCLS STERI-STRIP NONHPOA (GAUZE/BANDAGES/DRESSINGS)
APPLIER CLIP 11 MED OPEN (CLIP) ×2
APR CLP MED 11 20 MLT OPN (CLIP) ×2
BAG COUNTER SPONGE SURGICOUNT (BAG) ×2 IMPLANT
BAG DECANTER FOR FLEXI CONT (MISCELLANEOUS) ×1 IMPLANT
BAG SPNG CNTER NS LX DISP (BAG) ×3
BENZOIN TINCTURE PRP APPL 2/3 (GAUZE/BANDAGES/DRESSINGS) IMPLANT
BUR MATCHSTICK NEURO 3.0 LAGG (BURR) IMPLANT
CANISTER SUCT 3000ML PPV (MISCELLANEOUS) ×1 IMPLANT
CLIP APPLIE 11 MED OPEN (CLIP) ×2 IMPLANT
CLIP LIGATING EXTRA MED SLVR (CLIP) IMPLANT
DERMABOND ADVANCED .7 DNX12 (GAUZE/BANDAGES/DRESSINGS) IMPLANT
DRAPE C-ARM 42X72 X-RAY (DRAPES) ×3 IMPLANT
DRAPE LAPAROTOMY 100X72X124 (DRAPES) ×1 IMPLANT
DRSG OPSITE POSTOP 4X8 (GAUZE/BANDAGES/DRESSINGS) IMPLANT
ELECT BLADE 4.0 EZ CLEAN MEGAD (MISCELLANEOUS) ×1
ELECT BLADE 6.5 EXT (BLADE) ×1 IMPLANT
ELECT REM PT RETURN 9FT ADLT (ELECTROSURGICAL) ×1
ELECTRODE BLDE 4.0 EZ CLN MEGD (MISCELLANEOUS) ×1 IMPLANT
ELECTRODE REM PT RTRN 9FT ADLT (ELECTROSURGICAL) ×1 IMPLANT
GAUZE 4X4 16PLY ~~LOC~~+RFID DBL (SPONGE) IMPLANT
GAUZE SPONGE 4X4 12PLY STRL (GAUZE/BANDAGES/DRESSINGS) ×1 IMPLANT
GLOVE BIO SURGEON STRL SZ7.5 (GLOVE) ×1 IMPLANT
GLOVE BIOGEL PI IND STRL 8 (GLOVE) ×1 IMPLANT
GLOVE ECLIPSE 9.0 STRL (GLOVE) ×1 IMPLANT
GLOVE EXAM NITRILE XL STR (GLOVE) IMPLANT
GOWN STRL REUS W/ TWL LRG LVL3 (GOWN DISPOSABLE) ×1 IMPLANT
GOWN STRL REUS W/ TWL XL LVL3 (GOWN DISPOSABLE) ×2 IMPLANT
GOWN STRL REUS W/TWL 2XL LVL3 (GOWN DISPOSABLE) IMPLANT
GOWN STRL REUS W/TWL LRG LVL3 (GOWN DISPOSABLE) ×3
GOWN STRL REUS W/TWL XL LVL3 (GOWN DISPOSABLE) ×3
GRAFT TRINITY ELITE LGE HUMAN (Tissue) IMPLANT
INSERT FOGARTY 61MM (MISCELLANEOUS) IMPLANT
INSERT FOGARTY SM (MISCELLANEOUS) IMPLANT
KIT BASIN OR (CUSTOM PROCEDURE TRAY) ×1 IMPLANT
KIT INFUSE XX SMALL 0.7CC (Orthopedic Implant) IMPLANT
NDL SPNL 18GX3.5 QUINCKE PK (NEEDLE) ×1 IMPLANT
NEEDLE HYPO 22GX1.5 SAFETY (NEEDLE) ×1 IMPLANT
NEEDLE SPNL 18GX3.5 QUINCKE PK (NEEDLE) ×1 IMPLANT
NS IRRIG 1000ML POUR BTL (IV SOLUTION) ×1 IMPLANT
PACK LAMINECTOMY NEURO (CUSTOM PROCEDURE TRAY) ×1 IMPLANT
PAD ARMBOARD 7.5X6 YLW CONV (MISCELLANEOUS) ×2 IMPLANT
ROD SPINAL THRD DISP (MISCELLANEOUS) IMPLANT
SOL ELECTROSURG ANTI STICK (MISCELLANEOUS) ×1
SOLUTION ELECTROSURG ANTI STCK (MISCELLANEOUS) ×1 IMPLANT
SPACER HEDRON IA 29X39 15 15D (Spacer) IMPLANT
SPONGE INTESTINAL PEANUT (DISPOSABLE) ×3 IMPLANT
SPONGE SURGIFOAM ABS GEL 100 (HEMOSTASIS) ×2 IMPLANT
SPONGE T-LAP 18X18 ~~LOC~~+RFID (SPONGE) ×1 IMPLANT
STAPLER VISISTAT 35W (STAPLE) ×1 IMPLANT
STRIP CLOSURE SKIN 1/2X4 (GAUZE/BANDAGES/DRESSINGS) IMPLANT
SUT PDS AB 1 CTX 36 (SUTURE) ×1 IMPLANT
SUT PROLENE 4 0 RB 1 (SUTURE)
SUT PROLENE 4-0 RB1 .5 CRCL 36 (SUTURE) IMPLANT
SUT PROLENE 5 0 CC1 (SUTURE) IMPLANT
SUT PROLENE 6 0 C 1 30 (SUTURE) IMPLANT
SUT PROLENE 6 0 CC (SUTURE) IMPLANT
SUT SILK 0 TIES 10X30 (SUTURE) IMPLANT
SUT SILK 2 0 TIES 10X30 (SUTURE) ×2 IMPLANT
SUT SILK 2 0SH CR/8 30 (SUTURE) IMPLANT
SUT SILK 3 0 SH CR/8 (SUTURE) IMPLANT
SUT SILK 3 0 TIES 17X18 (SUTURE)
SUT SILK 3 0SH CR/8 30 (SUTURE) IMPLANT
SUT SILK 3-0 18XBRD TIE BLK (SUTURE) IMPLANT
SUT VIC AB 0 CT1 18XCR BRD8 (SUTURE) ×1 IMPLANT
SUT VIC AB 0 CT1 27 (SUTURE) ×1
SUT VIC AB 0 CT1 27XBRD ANBCTR (SUTURE) ×2 IMPLANT
SUT VIC AB 0 CT1 27XBRD ANTBC (SUTURE) IMPLANT
SUT VIC AB 0 CT1 8-18 (SUTURE) ×1
SUT VIC AB 2-0 CT1 18 (SUTURE) ×1 IMPLANT
SUT VIC AB 3-0 SH 8-18 (SUTURE) ×1 IMPLANT
TOWEL GREEN STERILE (TOWEL DISPOSABLE) ×2 IMPLANT
TOWEL GREEN STERILE FF (TOWEL DISPOSABLE) ×1 IMPLANT
TRAY FOLEY MTR SLVR 16FR STAT (SET/KITS/TRAYS/PACK) ×1 IMPLANT
WATER STERILE IRR 1000ML POUR (IV SOLUTION) ×1 IMPLANT

## 2023-04-24 NOTE — Op Note (Signed)
Date: April 24, 2023  Preoperative diagnosis: Chronic lower back pain  Postoperative diagnosis: Same  Procedure: Anterior spine exposure via anterior retroperitoneal approach for L5-S1 ALIF  Surgeon: Dr. Cephus Shelling, MD  Co-surgeon: Dr. Julio Sicks, MD  Indications: 57 year old female with severe chronic lower back pain.  She has marked disc degeneration with retrolisthesis and foraminal stenosis at L5-S1.  She has been evaluated by Dr. Jordan Likes who has recommended L5-S1 anterior lumbar interbody fusion.  She presents today after risks benefits discussed.  Findings: The L5-S1 disc space was marked over the left rectus muscle with fluoroscopic C arm.  Transverse incision was made here.  The anterior rectus sheath was opened and the left rectus muscle was mobilized to the midline.  Entered lateral to muscle and mobilized peritoneum and left ureter to the midline.  Middle sacral vessels were divided between clips.  Mobilized on both sides of the disc space including mobilizing the left iliac vein lateral.  Fixed NuVasive retractor was placed.  Anesthesia: General  Details: Patient was taken to the operating room after informed consent was obtained.  Placed on operative table in the supine position.  General endotracheal anesthesia was induced.  Fluoroscopic C-arm was then used in lateral position to mark the L5-S1 disc space over the left rectus muscle.  The abdominal wall was then prepped and draped in standard sterile fashion.  Antibiotics were given and timeout performed.  Went ahead and made a transverse incision over our preoperative mark over the left rectus muscle.  Dissected through the subcutaneous tissue with Bovie cautery and used cerebellar retractors.  The anterior rectus sheath was opened transversely and we raised flaps underneath the anterior rectus sheath.  The left rectus muscle was mobilized to the midline and we entered lateral to muscle and mobilized peritoneum and left ureter  to the midline.  Middle sacral vessels were identified and these were divided between clips.  I then bluntly mobilized on both sides of the disc space including mobilizing the left iliac vein lateral.  Fixed NuVasive retractor was placed on the field.  160 reverse lips were placed on each side of the disc space with a 140 reverse lip cranial and caudal.  A needle was placed in the disc space and we confirmed we were at the correct level on lateral fluoroscopy.  Case was turned over to Dr. Jordan Likes.  Complication: None  Condition: Stable  Cephus Shelling, MD [facility] [place] Office: [phone]   Cephus Shelling

## 2023-04-24 NOTE — H&P (Signed)
History and Physical Interval Note:  04/24/2023 7:16 AM  Rebecca Barker  has presented today for surgery, with the diagnosis of Spondylolisthesis.  The various methods of treatment have been discussed with the patient and family. After consideration of risks, benefits and other options for treatment, the patient has consented to  Procedure(s): ALIF - L5-S1 (N/A) ABDOMINAL EXPOSURE (N/A) as a surgical intervention.  The patient's history has been reviewed, patient examined, no change in status, stable for surgery.  I have reviewed the patient's chart and labs.  Questions were answered to the patient's satisfaction.    Anterior spine exposure for L5-S1 ALIF  Cephus Shelling     Patient name: Rebecca Barker        MRN: [id number]        DOB: January 29, 1966        Sex: female   REASON FOR CONSULT: Abdominal exposure for L5-S1 ALIF   HPI: Rebecca Barker is a 57 y.o. female, with hx HTN, DDD that presents for evaluation of anterior spine exposure for L5-S1 ALIF.  She has mechanical lumbar pain with intermittent lower extremity radiculopathy and this is worse in the right leg.  She has failed conservative management.  She has been evaluated by Dr. 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  Dr. Jordan Likes has recommended L5-S1 ALIF.  Previous abdominal surgery includes C-section x 2 and hysterectomy.       Past Medical History:  Diagnosis Date   Back pain     Degenerative spondylolisthesis     Hypertension     Migraines     Peptic ulcer     PONV (postoperative nausea and vomiting)     Tobacco abuse             Past Surgical History:  Procedure Laterality Date   ABDOMINAL HYSTERECTOMY   2007   CERVICAL FUSION   2008   CESAREAN SECTION   1999 and 2001   COLONOSCOPY       DILATION AND CURETTAGE OF UTERUS        x3   FOOT SURGERY   1980   LAPAROSCOPY               Family History  Problem Relation Age of Onset    Arthritis Mother     Hypertension Mother     Miscarriages / [place] Mother     Varicose Veins Mother     COPD Father     Birth defects Brother     Learning disabilities Brother     Mental retardation Brother     Arthritis Maternal Grandmother     Kidney disease Maternal Grandmother     Stroke Maternal Grandmother     Diabetes Maternal Grandfather     Stroke Maternal Grandfather     Arthritis Paternal Grandmother     Cancer Paternal Grandmother     Alcohol abuse Paternal Grandfather        SOCIAL HISTORY: Social History         Socioeconomic History   Marital status: Married      Spouse name: Not on file   Number of children: Not on file   Years of education: Not on file   Highest education level: Not on file  Occupational History   Not on file  Tobacco Use   Smoking status: Every Day      Packs/day: 0.25      Years: 15.00  Additional pack years: 0.00      Total pack years: 3.75      Types: Cigarettes      Start date: 2003   Smokeless tobacco: Never   Tobacco comments:      2 per day once starting [place] May 2024  Vaping Use   Vaping Use: Never used  Substance and Sexual Activity   Alcohol use: Not Currently   Drug use: Never   Sexual activity: Yes  Other Topics Concern   Not on file  Social History Narrative   Not on file    Social Determinants of Health    Financial Resource Strain: Not on file  Food Insecurity: Not on file  Transportation Needs: Not on file  Physical Activity: Not on file  Stress: Not on file  Social Connections: Not on file  Intimate Partner Violence: Not on file           Allergies  Allergen Reactions   Iodine Rash      topical   Betadine [Povidone Iodine] Rash            Current Outpatient Medications  Medication Sig Dispense Refill   albuterol (VENTOLIN HFA) 108 (90 Base) MCG/ACT inhaler Inhale 1-2 puffs into the lungs every 6 (six) hours as needed. 18 g 0   buPROPion (WELLBUTRIN XL) 150 MG 24 hr tablet Take 150  mg by mouth daily.       DHEA 50 MG CAPS Take 50 mg by mouth in the morning and at bedtime.       dicyclomine (BENTYL) 20 MG tablet Take 1 tablet (20 mg total) by mouth every 8 (eight) hours as needed for spasms (Abdominal cramping). (Patient taking differently: Take 20 mg by mouth 2 (two) times daily.) 20 tablet 0   famotidine (PEPCID) 40 MG tablet Take 40 mg by mouth at bedtime.       fluticasone (FLONASE) 50 MCG/ACT nasal spray Place 1 spray into the nose at bedtime as needed for allergies.       gabapentin (NEURONTIN) 300 MG capsule Take 300 mg by mouth at bedtime as needed (back pain).       hydrochlorothiazide (HYDRODIURIL) 25 MG tablet Take 25 mg by mouth daily as needed (For BP (June - Jan)). Does Not take with Lisinopril HCTZ       HYDROcodone-acetaminophen (NORCO/VICODIN) 5-325 MG tablet Take 1 tablet by mouth every 6 (six) hours as needed for severe pain.       losartan-hydrochlorothiazide (HYZAAR) 50-12.5 MG tablet Take 1 tablet by mouth daily.       meloxicam (MOBIC) 7.5 MG tablet Take 7.5 mg by mouth daily as needed for pain.       NONFORMULARY OR COMPOUNDED ITEM Apply 1 application  topically daily. Progesterone 4% Cream       NONFORMULARY OR COMPOUNDED ITEM Apply 1 application  topically daily. Biest Estrogen Cream 30 Biest 1 mg (70/30) Hrtcre       OVER THE COUNTER MEDICATION Take 1 capsule by mouth in the morning and at bedtime. DIM Supplement Estrogen Balance (Diindolylmethane)       pantoprazole (PROTONIX) 40 MG tablet Take 40 mg by mouth daily.       sucralfate (CARAFATE) 1 g tablet Take 1 g by mouth 2 (two) times daily as needed (stomach ulcer).       varenicline (CHANTIX) 1 MG tablet Take 1 mg by mouth daily as needed for smoking cessation.        No  current facility-administered medications for this visit.      REVIEW OF SYSTEMS:  [X]  denotes positive finding, [ ]  denotes negative finding Cardiac   Comments:  Chest pain or chest pressure:      Shortness of breath upon  exertion:      Short of breath when lying flat:      Irregular heart rhythm:             Vascular      Pain in calf, thigh, or hip brought on by ambulation:      Pain in feet at night that wakes you up from your sleep:       Blood clot in your veins:      Leg swelling:              Pulmonary      Oxygen at home:      Productive cough:       Wheezing:              Neurologic      Sudden weakness in arms or legs:       Sudden numbness in arms or legs:       Sudden onset of difficulty speaking or slurred speech:      Temporary loss of vision in one eye:       Problems with dizziness:              Gastrointestinal      Blood in stool:       Vomited blood:              Genitourinary      Burning when urinating:       Blood in urine:             Psychiatric      Major depression:              Hematologic      Bleeding problems:      Problems with blood clotting too easily:             Skin      Rashes or ulcers:             Constitutional      Fever or chills:          PHYSICAL EXAM: There were no vitals filed for this visit.   GENERAL: The patient is a well-nourished female, in no acute distress. The vital signs are documented above. CARDIAC: There is a regular rate and rhythm.  VASCULAR:  Bilateral femoral pulses palpable Bilateral DP pulses palpable PULMONARY: No respiratory distress. ABDOMEN: Soft and non-tender.  Previous lower transverse incision from C-section and hysterectomy. MUSCULOSKELETAL: There are no major deformities or cyanosis. NEUROLOGIC: No focal weakness or paresthesias are detected. SKIN: There are no ulcers or rashes noted. PSYCHIATRIC: The patient has a normal affect.   DATA:    MRI reviewed     Assessment/Plan:   58 y.o. female, with hx HTN and DDD that presents for evaluation of anterior spine exposure for L5-S1 ALIF.  She has mechanical lumbar pain with intermittent lower extremity radiculopathy worse in the right leg.  She has  failed conservative management.  Dr. 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    I reviewed her MRI imaging.  I discussed transverse incision over the left rectus muscle at the L5-S1 disc space.  I discussed mobilizing the rectus muscle and  entering in the retroperitoneum to mobilize peritoneum and left ureter across midline.  Discussed mobilizing iliac artery and vein to get the disc space exposed from the front.  Discussed risk of injury to the above structures.  Look forward to assisting Dr. Jordan Likes.  All questions answered.  She is scheduled for Monday.     Cephus Shelling, MD [facility] Office: [phone]

## 2023-04-24 NOTE — H&P (Signed)
Rebecca Barker is an 57 y.o. female.   Chief Complaint: Back Pain HPI: Patient is a 57 year old female with chronic and intractable lower back pain which is progressively worsened with time.  Workup demonstrates evidence of marked disc generation with disc base collapse and degenerative retrolisthesis with foraminal stenosis at L5-S1.  Patient presents now for L5-S1 anterior lumbar body fusion in hopes of improving her symptoms.  Past Medical History:  Diagnosis Date   Back pain    Degenerative spondylolisthesis    Hypertension    Migraines    Peptic ulcer    PONV (postoperative nausea and vomiting)    Tobacco abuse     Past Surgical History:  Procedure Laterality Date   ABDOMINAL HYSTERECTOMY  2007   CERVICAL FUSION  2008   CESAREAN SECTION  1999 and 2001   COLONOSCOPY     DILATION AND CURETTAGE OF UTERUS     x3   FOOT SURGERY  1980   LAPAROSCOPY      Family History  Problem Relation Age of Onset   Arthritis Mother    Hypertension Mother    Miscarriages / [place] Mother    Varicose Veins Mother    COPD Father    Birth defects Brother    Learning disabilities Brother    Mental retardation Brother    Arthritis Maternal Grandmother    Kidney disease Maternal Grandmother    Stroke Maternal Grandmother    Diabetes Maternal Grandfather    Stroke Maternal Grandfather    Arthritis Paternal Grandmother    Cancer Paternal Grandmother    Alcohol abuse Paternal Grandfather    Social History:  reports that she has been smoking cigarettes. She started smoking about 21 years ago. She has a 3.75 pack-year smoking history. She has never used smokeless tobacco. She reports that she does not currently use alcohol. She reports that she does not use drugs.  Allergies:  Allergies  Allergen Reactions   Iodine Rash    topical   Betadine [Povidone Iodine] Rash    Medications Prior to Admission  Medication Sig Dispense Refill   buPROPion (WELLBUTRIN XL) 150 MG 24 hr  tablet Take 150 mg by mouth daily.     DHEA 50 MG CAPS Take 50 mg by mouth in the morning and at bedtime.     dicyclomine (BENTYL) 20 MG tablet Take 1 tablet (20 mg total) by mouth every 8 (eight) hours as needed for spasms (Abdominal cramping). (Patient taking differently: Take 20 mg by mouth 2 (two) times daily.) 20 tablet 0   famotidine (PEPCID) 40 MG tablet Take 40 mg by mouth at bedtime.     gabapentin (NEURONTIN) 300 MG capsule Take 300 mg by mouth at bedtime as needed (back pain).     HYDROcodone-acetaminophen (NORCO/VICODIN) 5-325 MG tablet Take 1 tablet by mouth every 6 (six) hours as needed for severe pain.     losartan-hydrochlorothiazide (HYZAAR) 50-12.5 MG tablet Take 1 tablet by mouth daily.     meloxicam (MOBIC) 7.5 MG tablet Take 7.5 mg by mouth daily as needed for pain.     NONFORMULARY OR COMPOUNDED ITEM Apply 1 application  topically daily. Progesterone 4% Cream     NONFORMULARY OR COMPOUNDED ITEM Apply 1 application  topically daily. Biest Estrogen Cream 30 Biest 1 mg (70/30) Hrtcre     OVER THE COUNTER MEDICATION Take 1 capsule by mouth in the morning and at bedtime. DIM Supplement Estrogen Balance (Diindolylmethane)     pantoprazole (PROTONIX) 40  MG tablet Take 40 mg by mouth daily.     sucralfate (CARAFATE) 1 g tablet Take 1 g by mouth 2 (two) times daily as needed (stomach ulcer).     varenicline (CHANTIX) 1 MG tablet Take 1 mg by mouth daily as needed for smoking cessation.     albuterol (VENTOLIN HFA) 108 (90 Base) MCG/ACT inhaler Inhale 1-2 puffs into the lungs every 6 (six) hours as needed. 18 g 0   fluticasone (FLONASE) 50 MCG/ACT nasal spray Place 1 spray into the nose at bedtime as needed for allergies.     hydrochlorothiazide (HYDRODIURIL) 25 MG tablet Take 25 mg by mouth daily as needed (For BP (June - Jan)). Does Not take with Lisinopril HCTZ      Results for orders placed or performed during the hospital encounter of 04/24/23 (from the past 48 hour(s))  ABO/Rh      Status: None   Collection Time: 04/24/23  7:00 AM  Result Value Ref Range   ABO/RH(D)      O POS Performed at [facility], [address], [place], [place] [address]    No results found.  Pertinent items noted in HPI and remainder of comprehensive ROS otherwise negative.  Blood pressure 131/84, pulse 70, temperature 98.3 F (36.8 C), temperature source Oral, resp. rate 17, height 5\' 8"  (1.727 m), weight 92.5 kg, SpO2 96 %.  Patient is awake and alert.  She is oriented and appropriate.  Speech is fluent.  Judgment insight are intact.  Cranial nerve function normal bilateral.  Motor examination with intact motor strength bilateral sensory examination with some decrease sensation.  Light touch in her L5 dermatomes bilaterally.  Gait antalgic.  Posture reasonably normal peer examination head ears eyes nose and throat is unremarked.  Chest and abdomen benign.  Extremities are free from injury deformity. Assessment/Plan L5-S1 degenerative disc disease with degenerative retrolisthesis and foraminal stenosis with chronic back pain and radiculopathy.  Plan L5-S1 anterior lumbar MRI fusion utilizing interbody cage, anterior fixation, morselized allograft, and bone morphogenic protein.  Risks and benefits been explained.  Patient wishes to proceed.  Sherilyn Cooter A Lucca Greggs 04/24/2023, 7:49 AM

## 2023-04-24 NOTE — Op Note (Signed)
Date of procedure: 04/26/2023  Date of dictation: Same  Service: Neurosurgery  Preoperative diagnosis: L5-S1 degenerative disc disease with degenerative retrolisthesis and foraminal stenosis with back pain and radiculopathy  Postoperative diagnosis: Same  Procedure Name: L5-S1 anterior lumbar interbody fusion utilizing interbody cage, morselized allograft, bone morphogenic protein, and anterior fixation.  Surgeon:Sarah-Jane Nazario A.Edson Deridder, M.D.  Asst. Surgeon: Doran Durand, NP  Vascular Surgeon Chestine Spore, MD  Anesthesia: General  Indication: 57 year old female with progressively worsening back and bilateral lower extremity symptoms failing all conservative management workup demonstrates evidence of marked disc generation with the space collapse and degenerative retrolisthesis with severe foraminal stenosis at L5-S1.  Patient presents now for L5-S1 anterior lumbar interbody fusion.  Operative note: After induction of anesthesia, patient position supine with and appropriately padded.  Patient's lower abdominal region prepped and draped sterilely.  Incision made by Dr. Chestine Spore of vascular surgery service.  Retroperitoneal approach was then performed on the left side.  Self-retaining retractor was placed.  Fluoroscopy was used.  Level was confirmed.  I ascertain midline.  This was marked.  The space was then incised with 15 blade.  Discectomy then performed using various instruments down to level the posterior annulus.  Posterior annulus was then removed using curettes and Kerrison rongeurs.  Posterior logical ligament was resected.  Underlying thecal sac was identified.  Foraminotomies complete on the course exiting L5 nerve roots bilaterally.  At this point a very thorough decompression been achieved.  The disc space was sequentially released with dilators and distractors.  A 15 mm x 15 degree lordotic implant large size from [employer] was found to be most appropriate.  This was then packed with demineralized bone  fibers and a bone morphogenic protein soaked sponge.  This was then impacted in the place and recessed slightly from the anterior cortical margin of L5 and S1.  Locking anchors were then impacted into the bodies of L5 and 2 into the body of S1.  Final images revealed good position of the cages and the hardware at the proper operative level with normal alignment of spine.  Each locking mechanism was secured in place.  Extra demineralized bone graft was packed along the anterior aspect of the implant.  The retractor system was removed.  Hemostasis was excellent.  Wound was then closed in layers with Vicryl sutures.  Steri-Strips and sterile dressing were applied.  No apparent complications.  Patient tolerated the procedure well and she returns to recovery room postop.

## 2023-04-24 NOTE — Progress Notes (Signed)
Orthopedic Tech Progress Note Patient Details:  Rebecca Barker [birth year] [id number]  Patient has back brace   Patient ID: Rebecca Barker, female   DOB: 12-08-1965, 57 y.o.   MRN: [id number]  Donald Pore 04/24/2023, 11:09 AM

## 2023-04-24 NOTE — Anesthesia Procedure Notes (Signed)
Procedure Name: Intubation Date/Time: 04/24/2023 8:04 AM  Performed by: De Nurse, CRNAPre-anesthesia Checklist: Patient identified, Emergency Drugs available, Suction available and Patient being monitored Patient Re-evaluated:Patient Re-evaluated prior to induction Oxygen Delivery Method: Circle system utilized Preoxygenation: Pre-oxygenation with 100% oxygen Induction Type: IV induction Ventilation: Mask ventilation without difficulty Laryngoscope Size: Mac and 3 Grade View: Grade II Tube type: Oral Tube size: 7.0 mm Number of attempts: 1 Airway Equipment and Method: Stylet and Oral airway Placement Confirmation: ETT inserted through vocal cords under direct vision, positive ETCO2 and breath sounds checked- equal and bilateral Secured at: 23 cm Tube secured with: Tape Dental Injury: Teeth and Oropharynx as per pre-operative assessment  Comments: Placed by Terrial Rhodes

## 2023-04-24 NOTE — Anesthesia Preprocedure Evaluation (Signed)
Anesthesia Evaluation  Patient identified by MRN, date of birth, ID band Patient awake    Reviewed: Allergy & Precautions, H&P , NPO status , Patient's Chart, lab work & pertinent test results  History of Anesthesia Complications (+) PONV and history of anesthetic complications  Airway Mallampati: II  TM Distance: >3 FB Neck ROM: Full    Dental no notable dental hx.    Pulmonary Current Smoker and Patient abstained from smoking.   Pulmonary exam normal breath sounds clear to auscultation       Cardiovascular hypertension, Normal cardiovascular exam Rhythm:Regular Rate:Normal     Neuro/Psych negative neurological ROS  negative psych ROS   GI/Hepatic negative GI ROS, Neg liver ROS,,,  Endo/Other  negative endocrine ROS    Renal/GU negative Renal ROS  negative genitourinary   Musculoskeletal negative musculoskeletal ROS (+)    Abdominal   Peds negative pediatric ROS (+)  Hematology negative hematology ROS (+)   Anesthesia Other Findings   Reproductive/Obstetrics negative OB ROS                             Anesthesia Physical Anesthesia Plan  ASA: 2  Anesthesia Plan: General   Post-op Pain Management: Dilaudid IV and Tylenol PO (pre-op)*   Induction: Intravenous  PONV Risk Score and Plan: Ondansetron, Dexamethasone, Midazolam, Scopolamine patch - Pre-op and Treatment may vary due to age or medical condition  Airway Management Planned: Oral ETT  Additional Equipment:   Intra-op Plan:   Post-operative Plan: Extubation in OR  Informed Consent: I have reviewed the patients History and Physical, chart, labs and discussed the procedure including the risks, benefits and alternatives for the proposed anesthesia with the patient or authorized representative who has indicated his/her understanding and acceptance.     Dental advisory given  Plan Discussed with: CRNA and  Surgeon  Anesthesia Plan Comments:        Anesthesia Quick Evaluation

## 2023-04-24 NOTE — Brief Op Note (Signed)
04/24/2023  9:25 AM  PATIENT:  Rebecca Barker  57 y.o. female  PRE-OPERATIVE DIAGNOSIS:  Spondylolisthesis  POST-OPERATIVE DIAGNOSIS:  Spondylolisthesis  PROCEDURE:  Procedure(s): Anterior Lumbar Interbody Fusion Lumbar Five- Sacral One (N/A) ABDOMINAL EXPOSURE (N/A)  SURGEON:  Surgeon(s) and Role: Panel 1:    Julio Sicks, MD - Primary Panel 2:    Cephus Shelling, MD - Primary  PHYSICIAN ASSISTANT:   ASSISTANTSMarland Mcalpine   ANESTHESIA:   general  EBL:  20 mL   BLOOD ADMINISTERED:none  DRAINS: none   LOCAL MEDICATIONS USED:  MARCAINE     SPECIMEN:  No Specimen  DISPOSITION OF SPECIMEN:  N/A  COUNTS:  YES  TOURNIQUET:  * No tourniquets in log *  DICTATION: .Dragon Dictation  PLAN OF CARE: Admit to inpatient   PATIENT DISPOSITION:  PACU - hemodynamically stable.   Delay start of Pharmacological VTE agent (>24hrs) due to surgical blood loss or risk of bleeding: yes

## 2023-04-24 NOTE — Transfer of Care (Signed)
Immediate Anesthesia Transfer of Care Note  Patient: Rebecca Barker  Procedure(s) Performed: Anterior Lumbar Interbody Fusion Lumbar Five- Sacral One ABDOMINAL EXPOSURE  Patient Location: PACU  Anesthesia Type:General  Level of Consciousness: drowsy and patient cooperative  Airway & Oxygen Therapy: Patient Spontanous Breathing  Post-op Assessment: Report given to RN and Patient moving all extremities X 4  Post vital signs: Reviewed and stable  Last Vitals:  Vitals Value Taken Time  BP 120/63 04/24/23 0953  Temp    Pulse 79 04/24/23 0955  Resp 18 04/24/23 0955  SpO2 94 % 04/24/23 0955  Vitals shown include unvalidated device data.  Last Pain:  Vitals:   04/24/23 0554  TempSrc:   PainSc: 3       Patients Stated Pain Goal: 0 (04/24/23 0554)  Complications: No notable events documented.

## 2023-04-24 NOTE — Anesthesia Postprocedure Evaluation (Signed)
Anesthesia Post Note  Patient: Rebecca Barker  Procedure(s) Performed: Anterior Lumbar Interbody Fusion Lumbar Five- Sacral One ABDOMINAL EXPOSURE     Patient location during evaluation: PACU Anesthesia Type: General Level of consciousness: awake and alert Pain management: pain level controlled Vital Signs Assessment: post-procedure vital signs reviewed and stable Respiratory status: spontaneous breathing, nonlabored ventilation, respiratory function stable and patient connected to nasal cannula oxygen Cardiovascular status: blood pressure returned to baseline and stable Postop Assessment: no apparent nausea or vomiting Anesthetic complications: no  No notable events documented.  Last Vitals:  Vitals:   04/24/23 1015 04/24/23 1030  BP: 110/76 109/72  Pulse: 78 81  Resp: 15 15  Temp:    SpO2: 93% 92%    Last Pain:  Vitals:   04/24/23 1030  TempSrc:   PainSc: 8                  John Williamsen S

## 2023-04-25 MED ORDER — OXYCODONE HCL 10 MG PO TABS: 10.0000 mg | ORAL_TABLET | ORAL | 0 refills | Status: AC | PRN

## 2023-04-25 MED ORDER — DIAZEPAM 5 MG PO TABS: 5.0000 mg | ORAL_TABLET | Freq: Four times a day (QID) | ORAL | 0 refills | Status: AC | PRN

## 2023-04-25 NOTE — Progress Notes (Signed)
[facility]  Subjective  -no complaints.   Objective 132/77 77 98.2 F (36.8 C) (Oral) 20 99%  Intake/Output Summary (Last 24 hours) at 04/25/2023 0636 Last data filed at 04/24/2023 1500 Gross per 24 hour  Intake 2260 ml  Output 145 ml  Net 2115 ml   Abdomen with appropriate postop incisional tenderness and otherwise soft Bilateral DP pulses palpable  Laboratory Lab Results: No results for input(s): "WBC", "HGB", "HCT", "PLT" in the last 72 hours. BMET No results for input(s): "NA", "K", "CL", "CO2", "GLUCOSE", "BUN", "CREATININE", "CALCIUM" in the last 72 hours.  COAG No results found for: "INR", "PROTIME" No results found for: "PTT"  Assessment/Planning:  Postop day 1 status post anterior spine exposure for L5-S1 ALIF.  Looks good this morning.  Appropriate postop abdominal incisional tenderness.  Left DP palpable.  No significant nausea vomiting.  Looks good from a vascular standpoint.  Cephus Shelling 04/25/2023 6:36 AM --

## 2023-04-25 NOTE — Discharge Summary (Signed)
Physician Discharge Summary  Patient ID: Rebecca Barker MRN: [id number] DOB/AGE: 55-26-1967 55 y.o.  Admit date: 04/24/2023 Discharge date: 04/25/2023  Admission Diagnoses:  Discharge Diagnoses:  Principal Problem:   Degenerative spondylolisthesis   Discharged Condition: good  Hospital Course: Patient admitted to the hospital where she underwent uncomplicated lumbar anterior lumbar body fusion.  Postoperatively doing very well.  Preoperative back and lower extremity pain much improved.  Standing ambulating and voiding without difficulty.  Ready for discharge home.  Consults:   Significant Diagnostic Studies:   Treatments:   Discharge Exam: Blood pressure 113/84, pulse 71, temperature 97.8 F (36.6 C), temperature source Oral, resp. rate 16, height 5\' 8"  (1.727 m), weight 92.5 kg, SpO2 99 %. Awake and alert.  Oriented and appropriate.  Motor and sensory function intact.  Wound clean and dry.  Chest and abdomen benign.  Disposition: Discharge disposition: 01-Home or Self Care        Allergies as of 04/25/2023       Reactions   Iodine Rash   topical   Betadine [povidone Iodine] Rash        Medication List     TAKE these medications    albuterol 108 (90 Base) MCG/ACT inhaler Commonly known as: VENTOLIN HFA Inhale 1-2 puffs into the lungs every 6 (six) hours as needed.   buPROPion 150 MG 24 hr tablet Commonly known as: WELLBUTRIN XL Take 150 mg by mouth daily.   DHEA 50 MG Caps Take 50 mg by mouth in the morning and at bedtime.   diazepam 5 MG tablet Commonly known as: VALIUM Take 1-2 tablets (5-10 mg total) by mouth every 6 (six) hours as needed for muscle spasms.   dicyclomine 20 MG tablet Commonly known as: BENTYL Take 1 tablet (20 mg total) by mouth every 8 (eight) hours as needed for spasms (Abdominal cramping). What changed: when to take this   famotidine 40 MG tablet Commonly known as: PEPCID Take 40 mg by mouth at bedtime.    fluticasone 50 MCG/ACT nasal spray Commonly known as: FLONASE Place 1 spray into the nose at bedtime as needed for allergies.   gabapentin 300 MG capsule Commonly known as: NEURONTIN Take 300 mg by mouth at bedtime as needed (back pain).   hydrochlorothiazide 25 MG tablet Commonly known as: HYDRODIURIL Take 25 mg by mouth daily as needed (For BP (June - Jan)). Does Not take with Lisinopril HCTZ   HYDROcodone-acetaminophen 5-325 MG tablet Commonly known as: NORCO/VICODIN Take 1 tablet by mouth every 6 (six) hours as needed for severe pain.   losartan-hydrochlorothiazide 50-12.5 MG tablet Commonly known as: HYZAAR Take 1 tablet by mouth daily.   meloxicam 7.5 MG tablet Commonly known as: MOBIC Take 7.5 mg by mouth daily as needed for pain.   NONFORMULARY OR COMPOUNDED ITEM Apply 1 application  topically daily. Progesterone 4% Cream   NONFORMULARY OR COMPOUNDED ITEM Apply 1 application  topically daily. Biest Estrogen Cream 30 Biest 1 mg (70/30) Hrtcre   OVER THE COUNTER MEDICATION Take 1 capsule by mouth in the morning and at bedtime. DIM Supplement Estrogen Balance (Diindolylmethane)   Oxycodone HCl 10 MG Tabs Take 1 tablet (10 mg total) by mouth every 3 (three) hours as needed for severe pain ((score 7 to 10)).   pantoprazole 40 MG tablet Commonly known as: PROTONIX Take 40 mg by mouth daily.   sucralfate 1 g tablet Commonly known as: CARAFATE Take 1 g by mouth 2 (two) times daily as needed (  stomach ulcer).   varenicline 1 MG tablet Commonly known as: CHANTIX Take 1 mg by mouth daily as needed for smoking cessation.               Durable Medical Equipment  (From admission, onward)           Start     Ordered   04/24/23 1118  DME Walker rolling  Once       Question:  Patient needs a walker to treat with the following condition  Answer:  Degenerative spondylolisthesis   04/24/23 1117   04/24/23 1118  DME 3 n 1  Once        04/24/23 1117              Signed: Sherilyn Cooter A Yakub Lodes 04/25/2023, 8:10 AM

## 2023-04-25 NOTE — Evaluation (Signed)
Occupational Therapy Evaluation Patient Details Name: Rebecca Barker MRN: [id number] DOB: 09/27/1966 Today's Date: 04/25/2023   History of Present Illness Pt i s a 57 y/o female who presents s/p ALIF on 04/24/2023. PMH significant for HTN, migraines, degenerative spondylolithesis, cervical fusion 2008.   Clinical Impression   Patient admitted for above and presents with problem list below.  PTA she was independent. Patient was educated on brace mgmt and wear schedule, back precautions, ADL compensatory techniques, AE/DME, mobility progression, safety and recommendations.  Today, pt demonstrated ability to complete bed mobility with modified independence, transfers using RW with supervision, functional mobility using RW with supervision, and ADLs with supervision after education on compensatory techniques.  Pt will have support of family as needed, already has DME needed.  Based on performance today, no further OT needs have been identified and OT will sign off.       Recommendations for follow up therapy are one component of a multi-disciplinary discharge planning process, led by the attending physician.  Recommendations may be updated based on patient status, additional functional criteria and insurance authorization.   Assistance Recommended at Discharge PRN  Patient can return home with the following A little help with walking and/or transfers;A little help with bathing/dressing/bathroom;Assistance with cooking/housework;Assist for transportation;Help with stairs or ramp for entrance    Functional Status Assessment  Patient has had a recent decline in their functional status and demonstrates the ability to make significant improvements in function in a reasonable and predictable amount of time.  Equipment Recommendations  None recommended by OT    Recommendations for Other Services       Precautions / Restrictions Precautions Precautions: Back Precaution Booklet Issued: Yes  (comment) Precaution Comments: reviewed with pt Required Braces or Orthoses: Spinal Brace Spinal Brace: Lumbar corset;Applied in sitting position Restrictions Weight Bearing Restrictions: No      Mobility Bed Mobility Overal bed mobility: Modified Independent                  Transfers Overall transfer level: Needs assistance Equipment used: Rolling walker (2 wheels) Transfers: Sit to/from Stand Sit to Stand: Supervision           General transfer comment: for safety      Balance Overall balance assessment: Mild deficits observed, not formally tested (relies on RW)                                         ADL either performed or assessed with clinical judgement   ADL Overall ADL's : Needs assistance/impaired     Grooming: Supervision/safety;Standing       Lower Body Bathing: Supervison/ safety;Sitting/lateral leans;Sit to/from stand;Cueing for compensatory techniques Lower Body Bathing Details (indicate cue type and reason): educated on recommendations of long handled sponge Upper Body Dressing : Supervision/safety;Sitting   Lower Body Dressing: Supervision/safety;Sit to/from stand;Cueing for compensatory techniques;Adhering to back precautions Lower Body Dressing Details (indicate cue type and reason): pt would need assist with socks, but able to bring legs up high enough to don underwear/pants.  plans to wear slip on shoes.  Will have support of family as needed Toilet Transfer: Supervision/safety;Rolling walker (2 wheels);BSC/3in1;Ambulation   Toileting- Clothing Manipulation and Hygiene: Supervision/safety;Sit to/from stand   Tub/ Shower Transfer: Walk-in shower;Min guard;Ambulation;Shower Field seismologist Details (indicate cue type and reason): UE support on wall, simulated in room for side stepping to in shower  Functional mobility during ADLs: Supervision/safety;Rolling walker (2 wheels)       Vision   Vision Assessment?:  No apparent visual deficits     Perception     Praxis      Pertinent Vitals/Pain Pain Assessment Pain Assessment: Faces Faces Pain Scale: Hurts little more Pain Location: incisional Pain Descriptors / Indicators: Operative site guarding, Discomfort Pain Intervention(s): Limited activity within patient's tolerance, Monitored during session, Repositioned     Hand Dominance     Extremity/Trunk Assessment Upper Extremity Assessment Upper Extremity Assessment: Overall WFL for tasks assessed   Lower Extremity Assessment Lower Extremity Assessment: Defer to PT evaluation   Cervical / Trunk Assessment Cervical / Trunk Assessment: Back Surgery   Communication Communication Communication: No difficulties   Cognition Arousal/Alertness: Awake/alert Behavior During Therapy: WFL for tasks assessed/performed Overall Cognitive Status: Within Functional Limits for tasks assessed                                       General Comments  spouse present and supportive    Exercises     Shoulder Instructions      Home Living Family/patient expects to be discharged to:: Private residence Living Arrangements: Spouse/significant other Available Help at Discharge: Family;Friend(s) Type of Home: House       Home Layout: Two level     Bathroom Shower/Tub: Walk-in Pensions consultant: Standard     Home Equipment: Agricultural consultant (2 wheels);Cane - single point;BSC/3in1;Shower seat;Shower seat - built in;Toilet riser;Adaptive equipment Adaptive Equipment: Reacher        Prior Functioning/Environment Prior Level of Function : Independent/Modified Independent                        OT Problem List: Decreased activity tolerance;Impaired balance (sitting and/or standing);Decreased knowledge of use of DME or AE;Decreased knowledge of precautions;Pain      OT Treatment/Interventions:      OT Goals(Current goals can be found in the care plan  section) Acute Rehab OT Goals Patient Stated Goal: home OT Goal Formulation: With patient  OT Frequency:      Co-evaluation              AM-PAC OT "6 Clicks" Daily Activity     Outcome Measure Help from another person eating meals?: None Help from another person taking care of personal grooming?: A Little Help from another person toileting, which includes using toliet, bedpan, or urinal?: A Little Help from another person bathing (including washing, rinsing, drying)?: A Little Help from another person to put on and taking off regular upper body clothing?: A Little Help from another person to put on and taking off regular lower body clothing?: A Little 6 Click Score: 19   End of Session Equipment Utilized During Treatment: Rolling walker (2 wheels);Back brace Nurse Communication: Mobility status  Activity Tolerance: Patient tolerated treatment well Patient left: with call bell/phone within reach;with family/visitor present;Other (comment) (sitting EOB)  OT Visit Diagnosis: Other abnormalities of gait and mobility (R26.89);Pain Pain - part of body:  (incisonal)                Time: 1610-9604 OT Time Calculation (min): 29 min Charges:  OT General Charges $OT Visit: 1 Visit OT Evaluation $OT Eval Low Complexity: 1 Low OT Treatments $Self Care/Home Management : 8-22 mins  Barry Brunner, OT Acute Rehabilitation Services Office [phone]  Chancy Milroy 04/25/2023, 9:43 AM

## 2023-04-25 NOTE — Evaluation (Signed)
Physical Therapy Evaluation Patient Details Name: Rebecca Barker MRN: [id number] DOB: [birth year] Today's Date: 04/25/2023  History of Present Illness  Pt i s a 57 y/o female who presents s/p ALIF on 04/24/2023. PMH significant for HTN, migraines, degenerative spondylolithesis, cervical fusion 2008.   Clinical Impression  Upon evaluation, patient supine in bed with HOB elevated and spouse present. Prior to admission, patient was independent with all functional mobility and ADLs. She is supported by her spouse and son PRN following discharge. Currently, patient is able to perform bed mobility and transfers without physical assistance. Provided supervision to min guard with stair training; educated spouse on proper guarding technique with descent. End of session included patient education on lumbar brace wear schedule, exercise progression, car transfers and precautions (able to recall 3/3). Recommending discharge to home with family support as needed.        Recommendations for follow up therapy are one component of a multi-disciplinary discharge planning process, led by the attending physician.  Recommendations may be updated based on patient status, additional functional criteria and insurance authorization.  Follow Up Recommendations       Assistance Recommended at Discharge PRN  Patient can return home with the following  A little help with walking and/or transfers;A little help with bathing/dressing/bathroom;Assist for transportation;Help with stairs or ramp for entrance    Equipment Recommendations None recommended by PT  Recommendations for Other Services       Functional Status Assessment Patient has had a recent decline in their functional status and demonstrates the ability to make significant improvements in function in a reasonable and predictable amount of time.     Precautions / Restrictions Precautions Precautions: Back;Fall Precaution Booklet Issued: Yes  (comment) Precaution Comments: reviewed with pt Required Braces or Orthoses: Spinal Brace Spinal Brace: Lumbar corset;Applied in sitting position Restrictions Weight Bearing Restrictions: No      Mobility  Bed Mobility Overal bed mobility: Modified Independent             General bed mobility comments: Increased time with rail support; able to perform without physical assistance. HOB lowered and bed rail support provided to simulate home environment    Transfers Overall transfer level: Needs assistance Equipment used: Rolling walker (2 wheels) Transfers: Sit to/from Stand Sit to Stand: Supervision           General transfer comment: Able to perform without physical assistance; increased time; vc for hand placement    Ambulation/Gait Ambulation/Gait assistance: Supervision Gait Distance (Feet): 350 Feet Assistive device: Rolling walker (2 wheels), None Gait Pattern/deviations: Step-through pattern, Decreased step length - right, Decreased stride length Gait velocity: Decreased Gait velocity interpretation: <1.8 ft/sec, indicate of risk for recurrent falls   General Gait Details: Presented with upright posture and decreased gait velocity; right foot externally rotated but able to clear; trialed amb without RW able to perform without physical assistance but very cautious steps  Stairs Stairs: Yes Stairs assistance: Min guard Stair Management: One rail Left Number of Stairs: 10 General stair comments: Min Guard for safety; educated with sideways technique ascending. Performed without physical assistance.  Wheelchair Mobility    Modified Rankin (Stroke Patients Only)       Balance                                             Pertinent Vitals/Pain Pain Assessment Pain Assessment:  0-10 Pain Score: 7  Pain Location: Incision Site Pain Descriptors / Indicators: Operative site guarding Pain Intervention(s): Limited activity within patient's  tolerance, Monitored during session    Home Living Family/patient expects to be discharged to:: Private residence Living Arrangements: Spouse/significant other Available Help at Discharge: Family;Friend(s) Type of Home: House Home Access: Stairs to enter Entrance Stairs-Rails: Left Entrance Stairs-Number of Steps: 2 Alternate Level Stairs-Number of Steps: 13 Home Layout: Two level Home Equipment: Agricultural consultant (2 wheels);Cane - single point;BSC/3in1;Shower seat;Shower seat - built in;Toilet riser;Adaptive equipment Additional Comments: Able to obtain DME from family members    Prior Function Prior Level of Function : Independent/Modified Independent             Mobility Comments: Independent ADLs Comments: Independent     Hand Dominance   Dominant Hand: Right    Extremity/Trunk Assessment   Upper Extremity Assessment Upper Extremity Assessment: Overall WFL for tasks assessed    Lower Extremity Assessment Lower Extremity Assessment: Defer to PT evaluation    Cervical / Trunk Assessment Cervical / Trunk Assessment: Back Surgery  Communication   Communication: No difficulties  Cognition Arousal/Alertness: Awake/alert Behavior During Therapy: WFL for tasks assessed/performed Overall Cognitive Status: Within Functional Limits for tasks assessed                                 General Comments: A&O x4; pleasant throughout the entire session        General Comments General comments (skin integrity, edema, etc.): spouse present and supportive    Exercises     Assessment/Plan    PT Assessment Patient does not need any further PT services  PT Problem List         PT Treatment Interventions      PT Goals (Current goals can be found in the Care Plan section)  Acute Rehab PT Goals Patient Stated Goal: Patient would like to return home without pain PT Goal Formulation: All assessment and education complete, DC therapy    Frequency        Co-evaluation               AM-PAC PT "6 Clicks" Mobility  Outcome Measure Help needed turning from your back to your side while in a flat bed without using bedrails?: None Help needed moving from lying on your back to sitting on the side of a flat bed without using bedrails?: None Help needed moving to and from a bed to a chair (including a wheelchair)?: None Help needed standing up from a chair using your arms (e.g., wheelchair or bedside chair)?: None Help needed to walk in hospital room?: A Little Help needed climbing 3-5 steps with a railing? : A Little 6 Click Score: 22    End of Session Equipment Utilized During Treatment: Gait belt Activity Tolerance: Patient tolerated treatment well Patient left: in bed;with call bell/phone within reach;with family/visitor present Nurse Communication: Mobility status PT Visit Diagnosis: Difficulty in walking, not elsewhere classified (R26.2);Pain Pain - Right/Left: Right Pain - part of body: Leg (Back)    Time: 0981-1914 PT Time Calculation (min) (ACUTE ONLY): 31 min   Charges:   PT Evaluation $PT Eval Low Complexity: 1 Low PT Treatments $Gait Training: 8-22 mins        Christene Lye, SPT Acute Rehabilitation Services [phone] Secure chat preferred    Christene Lye 04/25/2023, 10:53 AM

## 2023-04-25 NOTE — Discharge Instructions (Addendum)
Wound Care Keep incision covered and dry for three days.   Do not put any creams, lotions, or ointments on incision. Leave steri-strips on back.  They will fall off by themselves. Activity Walk each and every day, increasing distance each day. No lifting greater than 8 lbs.  Avoid excessive neck motion. No driving for 2 weeks; may ride as a passenger locally. If provided with back brace, wear when out of bed.  It is not necessary to wear brace in bed. Diet Resume your normal diet.  Return to Work Will be discussed at you follow up appointment. Call Your Doctor If Any of These Occur Redness, drainage, or swelling at the wound.  Temperature greater than 101 degrees. Severe pain not relieved by pain medication. Incision starts to come apart. Follow Up Appt Call  weeks ([phone]) for problems.  If you have any hardware placed in your spine, you will need an x-ray before your appointment.

## 2023-04-25 NOTE — TOC Transition Note (Signed)
Transition of Care [facility]) - CM/SW Discharge Note   Patient Details  Name: Rebecca Barker MRN: [id number] Date of Birth: July 29, 1966  [facility] CM/SW Contact:  Lawerance Sabal, RN Phone Number: 04/25/2023, 10:05 AM   Clinical Narrative:     Patient with order to DC to home today. Unit staff to provide DME needed for home.   No HH needs identified  Patient will have family/ friends provide transportation home. No other TOC needs identified for DC        Patient Goals and CMS Choice      Discharge Placement                         Discharge Plan and Services Additional resources added to the After Visit Summary for                                       Social Determinants of Health (SDOH) Interventions SDOH Screenings   Tobacco Use: High Risk (04/24/2023)     Readmission Risk Interventions     No data to display

## 2023-04-25 NOTE — Progress Notes (Signed)
Patient alert and oriented, voiding adequately, skin clean, dry and intact without evidence of skin break down, or symptoms of complications - no redness or edema noted, only slight tenderness at site.  Patient states pain is manageable at time of discharge. Patient has an appointment with MD in 2 weeks 

## 2023-04-26 ENCOUNTER — Encounter (HOSPITAL_COMMUNITY): Payer: Self-pay | Admitting: Neurosurgery

## 2023-05-12 MED FILL — Heparin Sodium (Porcine) Inj 1000 Unit/ML: INTRAMUSCULAR | Qty: 30 | Status: AC

## 2023-05-29 ENCOUNTER — Telehealth: Payer: Self-pay

## 2023-05-29 NOTE — Telephone Encounter (Signed)
Returned pt's call regarding her RLE pain that she experiences only when laying down. This began about 2 weeks ago. She is s/p ALIF on 04/24/23. This was the same side that she previously experienced lumbar radiculopathy on prior to ALIF. Pt states Dr. Jordan Likes is aware and she has started Gabapentin recently which has noticeably helped, as she was able to sleep through the night the last 4 days. Pt has been advised to discuss this further with Dr. Jordan Likes, as he is her spine surgeon and to have his office let us know if pt should be seen/referral entered. Pt verbalized understanding of the above-no further questions/concerns at this time.

## 2023-08-17 ENCOUNTER — Other Ambulatory Visit: Payer: Self-pay | Admitting: Neurosurgery

## 2023-08-17 DIAGNOSIS — M4310 Spondylolisthesis, site unspecified: Secondary | ICD-10-CM

## 2023-08-21 ENCOUNTER — Encounter: Payer: Self-pay | Admitting: Neurosurgery

## 2023-08-25 ENCOUNTER — Ambulatory Visit
Admission: RE | Admit: 2023-08-25 | Discharge: 2023-08-25 | Disposition: A | Discharge status: Home / Self Care | Payer: BC Managed Care – PPO | Source: Ambulatory Visit | Attending: Neurosurgery | Admitting: Neurosurgery

## 2023-08-25 DIAGNOSIS — M4310 Spondylolisthesis, site unspecified: Secondary | ICD-10-CM
